# Patient Record
Sex: Female | Born: 1970 | Race: White | Hispanic: No | Marital: Married | Smoking: Former smoker
Health system: Southern US, Community
[De-identification: ages and names within clinical notes are randomized; demographics above are authoritative.]

## PROBLEM LIST (undated history)

## (undated) DIAGNOSIS — R112 Nausea with vomiting, unspecified: Secondary | ICD-10-CM

## (undated) DIAGNOSIS — Z923 Personal history of irradiation: Secondary | ICD-10-CM

## (undated) DIAGNOSIS — Z789 Other specified health status: Secondary | ICD-10-CM

## (undated) DIAGNOSIS — Z9889 Other specified postprocedural states: Secondary | ICD-10-CM

## (undated) DIAGNOSIS — D219 Benign neoplasm of connective and other soft tissue, unspecified: Secondary | ICD-10-CM

## (undated) HISTORY — DX: Personal history of irradiation: Z92.3

## (undated) HISTORY — PX: ABDOMINAL HYSTERECTOMY: SHX81

## (undated) HISTORY — PX: OTHER SURGICAL HISTORY: SHX169

## (undated) HISTORY — DX: Nausea with vomiting, unspecified: R11.2

## (undated) HISTORY — PX: HYSTEROSCOPY: SHX211

## (undated) HISTORY — PX: TONSILLECTOMY: SUR1361

## (undated) HISTORY — DX: Benign neoplasm of connective and other soft tissue, unspecified: D21.9

## (undated) HISTORY — PX: DILATION AND CURETTAGE OF UTERUS: SHX78

---

## 2000-05-11 ENCOUNTER — Other Ambulatory Visit: Admission: RE | Admit: 2000-05-11 | Discharge: 2000-05-11 | Payer: Self-pay | Admitting: Gynecology

## 2001-07-26 ENCOUNTER — Other Ambulatory Visit: Admission: RE | Admit: 2001-07-26 | Discharge: 2001-07-26 | Payer: Self-pay | Admitting: Gynecology

## 2003-05-07 ENCOUNTER — Other Ambulatory Visit: Admission: RE | Admit: 2003-05-07 | Discharge: 2003-05-07 | Payer: Self-pay | Admitting: Gynecology

## 2004-08-13 ENCOUNTER — Other Ambulatory Visit: Admission: RE | Admit: 2004-08-13 | Discharge: 2004-08-13 | Payer: Self-pay | Admitting: Obstetrics and Gynecology

## 2008-06-03 ENCOUNTER — Encounter: Admission: RE | Admit: 2008-06-03 | Discharge: 2008-06-03 | Payer: Self-pay | Admitting: Obstetrics and Gynecology

## 2009-09-29 ENCOUNTER — Ambulatory Visit (HOSPITAL_COMMUNITY): Admission: RE | Admit: 2009-09-29 | Discharge: 2009-09-29 | Payer: Self-pay | Admitting: Obstetrics and Gynecology

## 2010-04-16 LAB — CBC
HCT: 39.2 % (ref 36.0–46.0)
Hemoglobin: 13.5 g/dL (ref 12.0–15.0)
MCV: 93.8 fL (ref 78.0–100.0)
RBC: 4.18 MIL/uL (ref 3.87–5.11)
RDW: 12.2 % (ref 11.5–15.5)
WBC: 5.9 10*3/uL (ref 4.0–10.5)

## 2010-04-16 LAB — URINALYSIS, ROUTINE W REFLEX MICROSCOPIC
Bilirubin Urine: NEGATIVE
Glucose, UA: NEGATIVE mg/dL
Hgb urine dipstick: NEGATIVE
Specific Gravity, Urine: 1.015 (ref 1.005–1.030)
Urobilinogen, UA: 0.2 mg/dL (ref 0.0–1.0)
pH: 7.5 (ref 5.0–8.0)

## 2010-07-28 ENCOUNTER — Other Ambulatory Visit: Payer: Self-pay | Admitting: Obstetrics and Gynecology

## 2011-12-19 ENCOUNTER — Encounter (HOSPITAL_COMMUNITY): Payer: Self-pay | Admitting: Pharmacist

## 2011-12-28 ENCOUNTER — Encounter (HOSPITAL_COMMUNITY): Payer: Self-pay

## 2011-12-28 ENCOUNTER — Encounter (HOSPITAL_COMMUNITY)
Admission: RE | Admit: 2011-12-28 | Discharge: 2011-12-28 | Disposition: A | Discharge status: Home / Self Care | Payer: BC Managed Care – PPO | Source: Ambulatory Visit | Attending: Obstetrics and Gynecology | Admitting: Obstetrics and Gynecology

## 2011-12-28 HISTORY — DX: Other specified health status: Z78.9

## 2011-12-28 LAB — CBC
Hemoglobin: 13.1 g/dL (ref 12.0–15.0)
MCHC: 34.9 g/dL (ref 30.0–36.0)
WBC: 5.7 10*3/uL (ref 4.0–10.5)

## 2011-12-28 LAB — SURGICAL PCR SCREEN
MRSA, PCR: NEGATIVE
Staphylococcus aureus: NEGATIVE

## 2011-12-28 NOTE — Patient Instructions (Addendum)
18 Shelly [name]  12/28/2011   Your procedure is scheduled on:  01/02/12  Enter through the Main Entrance of [facility] at 6 AM.  Pick up the phone at the desk and dial [phone].   Call this number if you have problems the morning of surgery: [phone]   Remember:   Do not eat food:After Midnight.  Do not drink clear liquids: After Midnight.  Take these medicines the morning of surgery with A SIP OF WATER: NA   Do not wear jewelry, make-up or nail polish.  Do not wear lotions, powders, or perfumes. You may wear deodorant.  Do not shave 48 hours prior to surgery.  Do not bring valuables to the hospital.  Contacts, dentures or bridgework may not be worn into surgery.  Leave suitcase in the car. After surgery it may be brought to your room.  For patients admitted to the hospital, checkout time is 11:00 AM the day of discharge.   Patients discharged the day of surgery will not be allowed to drive home.  Name and phone number of your driver: NA  Special Instructions: Shower using CHG 2 nights before surgery and the night before surgery.  If you shower the day of surgery use CHG.  Use special wash - you have one bottle of CHG for all showers.  You should use approximately 1/3 of the bottle for each shower.   Please read over the following fact sheets that you were given: MRSA Information

## 2011-12-28 NOTE — H&P (Signed)
Shelly [name] is an 41 y.o. female.   Chief Complaint:abdominal bloating, feeling "full" HPI: 41 yo PWF G0P0 with large fibroids and above complaints for definitive tx with R-TLH/bilat salping.  Past Medical History  Diagnosis Date  . No pertinent past medical history     Past Surgical History  Procedure Date  . Tonsillectomy   . Uterine ablation     No family history on file. Social History:  reports that she has quit smoking. She has never used smokeless tobacco. She reports that she drinks alcohol. She reports that she uses illicit drugs (Solvent inhalants).  Allergies: No Known Allergies  No prescriptions prior to admission    Results for orders placed during the hospital encounter of 12/28/11 (from the past 48 hour(s))  CBC     Status: Normal   Collection Time   12/28/11  8:13 AM      Component Value Range Comment   WBC 5.7  4.0 - 10.5 K/uL    RBC 4.21  3.87 - 5.11 MIL/uL    Hemoglobin 13.1  12.0 - 15.0 g/dL    HCT 45.4  09.8 - 11.9 %    MCV 89.1  78.0 - 100.0 fL    MCH 31.1  26.0 - 34.0 pg    MCHC 34.9  30.0 - 36.0 g/dL    RDW 14.7  82.9 - 56.2 %    Platelets 203  150 - 400 K/uL   SURGICAL PCR SCREEN     Status: Normal   Collection Time   12/28/11  9:03 AM      Component Value Range Comment   MRSA, PCR NEGATIVE  NEGATIVE    Staphylococcus aureus NEGATIVE  NEGATIVE    No results found.  Review of Systems  All other systems reviewed and are negative.    Height 5\' 6"  (1.676 m), weight 63.504 kg (140 lb). Physical Exam  Constitutional: She is oriented to person, place, and time. She appears well-developed and well-nourished.  HENT:  Head: Normocephalic and atraumatic.  Eyes: Conjunctivae normal are normal.  Neck: Normal range of motion. Neck supple. No thyromegaly present.  Cardiovascular: Normal rate, regular rhythm and normal heart sounds.   Respiratory: Effort normal and breath sounds normal. No respiratory distress.  GI: Soft. Bowel sounds are  normal. She exhibits no distension. There is no tenderness.  Genitourinary: Vagina normal.       Uterus 14 week size mobile, largest fibroid off L fundus  Musculoskeletal: Normal range of motion.  Neurological: She is alert and oriented to person, place, and time.  Skin: Skin is warm and dry.  Psychiatric: She has a normal mood and affect.     Assessment/Plan Symptomatic fibroids, for R-TLH, bilat salping.  [name],[name] 12/28/2011, 12:55 PM

## 2012-01-01 DIAGNOSIS — D219 Benign neoplasm of connective and other soft tissue, unspecified: Secondary | ICD-10-CM

## 2012-01-01 HISTORY — DX: Benign neoplasm of connective and other soft tissue, unspecified: D21.9

## 2012-01-01 MED ORDER — CEFOTETAN DISODIUM 2 G IJ SOLR
2.0000 g | INTRAMUSCULAR | Status: AC
  Administered 2012-01-02: 2 g via INTRAVENOUS
  Filled 2012-01-01: qty 2

## 2012-01-02 ENCOUNTER — Ambulatory Visit (HOSPITAL_COMMUNITY)
Admission: RE | Admit: 2012-01-02 | Discharge: 2012-01-02 | Disposition: A | Discharge status: Home / Self Care | Payer: BC Managed Care – PPO | Source: Ambulatory Visit | Attending: Obstetrics and Gynecology | Admitting: Obstetrics and Gynecology

## 2012-01-02 ENCOUNTER — Ambulatory Visit (HOSPITAL_COMMUNITY): Payer: BC Managed Care – PPO | Admitting: Anesthesiology

## 2012-01-02 ENCOUNTER — Encounter (HOSPITAL_COMMUNITY): Payer: Self-pay | Admitting: Anesthesiology

## 2012-01-02 ENCOUNTER — Encounter (HOSPITAL_COMMUNITY)
Admission: RE | Disposition: A | Discharge status: Home / Self Care | Payer: Self-pay | Source: Ambulatory Visit | Attending: Obstetrics and Gynecology

## 2012-01-02 ENCOUNTER — Encounter (HOSPITAL_COMMUNITY): Payer: Self-pay | Admitting: *Deleted

## 2012-01-02 DIAGNOSIS — D251 Intramural leiomyoma of uterus: Secondary | ICD-10-CM | POA: Insufficient documentation

## 2012-01-02 DIAGNOSIS — N8000 Endometriosis of the uterus, unspecified: Secondary | ICD-10-CM | POA: Insufficient documentation

## 2012-01-02 DIAGNOSIS — Z01812 Encounter for preprocedural laboratory examination: Secondary | ICD-10-CM | POA: Insufficient documentation

## 2012-01-02 DIAGNOSIS — Z01818 Encounter for other preprocedural examination: Secondary | ICD-10-CM | POA: Insufficient documentation

## 2012-01-02 HISTORY — PX: CYSTOSCOPY: SHX5120

## 2012-01-02 HISTORY — PX: ROBOTIC ASSISTED TOTAL HYSTERECTOMY: SHX6085

## 2012-01-02 LAB — COMPREHENSIVE METABOLIC PANEL
ALT: 9 U/L (ref 0–35)
Albumin: 3.7 g/dL (ref 3.5–5.2)
Alkaline Phosphatase: 46 U/L (ref 39–117)
Chloride: 103 mEq/L (ref 96–112)
GFR calc Af Amer: >90 mL/min (ref >90)
Glucose, Bld: 93 mg/dL (ref 70–99)
Potassium: 3.6 mEq/L (ref 3.5–5.1)
Sodium: 138 mEq/L (ref 135–145)
Total Bilirubin: 0.3 mg/dL (ref 0.3–1.2)
Total Protein: 7 g/dL (ref 6.0–8.3)

## 2012-01-02 LAB — HCG, SERUM, QUALITATIVE: Preg, Serum: NEGATIVE

## 2012-01-02 SURGERY — ROBOTIC ASSISTED TOTAL HYSTERECTOMY
Anesthesia: General | Site: Bladder | Wound class: Clean Contaminated

## 2012-01-02 MED ORDER — PROPOFOL 10 MG/ML IV EMUL
INTRAVENOUS | Status: DC | PRN
  Administered 2012-01-02: 150 mg via INTRAVENOUS

## 2012-01-02 MED ORDER — DEXTROSE IN LACTATED RINGERS 5 % IV SOLN
INTRAVENOUS | Status: DC
  Administered 2012-01-02: 17:00:00 via INTRAVENOUS

## 2012-01-02 MED ORDER — FENTANYL CITRATE 0.05 MG/ML IJ SOLN
INTRAMUSCULAR | Status: AC
  Filled 2012-01-02: qty 5

## 2012-01-02 MED ORDER — LACTATED RINGERS IR SOLN
Status: DC | PRN
  Administered 2012-01-02: 3000 mL

## 2012-01-02 MED ORDER — PROPOFOL 10 MG/ML IV EMUL
INTRAVENOUS | Status: AC
  Filled 2012-01-02: qty 20

## 2012-01-02 MED ORDER — EPHEDRINE SULFATE 50 MG/ML IJ SOLN
INTRAMUSCULAR | Status: DC | PRN
  Administered 2012-01-02 (×3): 5 mg via INTRAVENOUS

## 2012-01-02 MED ORDER — FENTANYL CITRATE 0.05 MG/ML IJ SOLN
25.0000 ug | INTRAMUSCULAR | Status: DC | PRN
  Administered 2012-01-02 (×5): 25 ug via INTRAVENOUS

## 2012-01-02 MED ORDER — ACETAMINOPHEN 10 MG/ML IV SOLN
1000.0000 mg | Freq: Once | INTRAVENOUS | Status: DC
  Filled 2012-01-02: qty 100

## 2012-01-02 MED ORDER — FENTANYL CITRATE 0.05 MG/ML IJ SOLN
INTRAMUSCULAR | Status: DC | PRN
  Administered 2012-01-02 (×4): 50 ug via INTRAVENOUS

## 2012-01-02 MED ORDER — KETOROLAC TROMETHAMINE 30 MG/ML IJ SOLN: 15.0000 mg | Freq: Once | INTRAMUSCULAR | Status: DC | PRN

## 2012-01-02 MED ORDER — FENTANYL CITRATE 0.05 MG/ML IJ SOLN
INTRAMUSCULAR | Status: AC
  Administered 2012-01-02: 25 ug via INTRAVENOUS
  Filled 2012-01-02: qty 2

## 2012-01-02 MED ORDER — METOCLOPRAMIDE HCL 5 MG/ML IJ SOLN
INTRAMUSCULAR | Status: AC
  Administered 2012-01-02: 10 mg via INTRAVENOUS
  Filled 2012-01-02: qty 2

## 2012-01-02 MED ORDER — ONDANSETRON HCL 4 MG/2ML IJ SOLN: 4.0000 mg | Freq: Once | INTRAMUSCULAR | Status: DC | PRN

## 2012-01-02 MED ORDER — NEOSTIGMINE METHYLSULFATE 1 MG/ML IJ SOLN
INTRAMUSCULAR | Status: DC | PRN
  Administered 2012-01-02: 4 mg via INTRAVENOUS

## 2012-01-02 MED ORDER — ROPIVACAINE HCL 5 MG/ML IJ SOLN
INTRAMUSCULAR | Status: AC
  Filled 2012-01-02: qty 60

## 2012-01-02 MED ORDER — LIDOCAINE HCL (CARDIAC) 20 MG/ML IV SOLN
INTRAVENOUS | Status: DC | PRN
  Administered 2012-01-02: 80 mg via INTRAVENOUS

## 2012-01-02 MED ORDER — MORPHINE SULFATE 4 MG/ML IJ SOLN
1.0000 mg | INTRAMUSCULAR | Status: DC | PRN
  Administered 2012-01-02: 1 mg via INTRAVENOUS
  Filled 2012-01-02: qty 1

## 2012-01-02 MED ORDER — ARTIFICIAL TEARS OP OINT
TOPICAL_OINTMENT | OPHTHALMIC | Status: DC | PRN
  Administered 2012-01-02: 1 via OPHTHALMIC

## 2012-01-02 MED ORDER — MIDAZOLAM HCL 5 MG/5ML IJ SOLN
INTRAMUSCULAR | Status: DC | PRN
  Administered 2012-01-02: 2 mg via INTRAVENOUS

## 2012-01-02 MED ORDER — ROCURONIUM BROMIDE 50 MG/5ML IV SOLN
INTRAVENOUS | Status: AC
  Filled 2012-01-02: qty 2

## 2012-01-02 MED ORDER — ONDANSETRON HCL 4 MG/2ML IJ SOLN
INTRAMUSCULAR | Status: AC
  Filled 2012-01-02: qty 2

## 2012-01-02 MED ORDER — LIDOCAINE HCL (CARDIAC) 20 MG/ML IV SOLN
INTRAVENOUS | Status: AC
  Filled 2012-01-02: qty 5

## 2012-01-02 MED ORDER — GLYCOPYRROLATE 0.2 MG/ML IJ SOLN
INTRAMUSCULAR | Status: DC | PRN
  Administered 2012-01-02: .8 mg via INTRAVENOUS

## 2012-01-02 MED ORDER — METOCLOPRAMIDE HCL 5 MG/ML IJ SOLN
10.0000 mg | Freq: Once | INTRAMUSCULAR | Status: AC
  Administered 2012-01-02: 10 mg via INTRAVENOUS

## 2012-01-02 MED ORDER — INDIGOTINDISULFONATE SODIUM 8 MG/ML IJ SOLN
INTRAMUSCULAR | Status: DC | PRN
  Administered 2012-01-02: 3 mL via INTRAVENOUS

## 2012-01-02 MED ORDER — ROCURONIUM BROMIDE 100 MG/10ML IV SOLN
INTRAVENOUS | Status: DC | PRN
  Administered 2012-01-02 (×3): 10 mg via INTRAVENOUS
  Administered 2012-01-02: 40 mg via INTRAVENOUS
  Administered 2012-01-02 (×3): 10 mg via INTRAVENOUS

## 2012-01-02 MED ORDER — GLYCOPYRROLATE 0.2 MG/ML IJ SOLN
INTRAMUSCULAR | Status: AC
  Filled 2012-01-02: qty 1

## 2012-01-02 MED ORDER — KETOROLAC TROMETHAMINE 30 MG/ML IJ SOLN
INTRAMUSCULAR | Status: DC | PRN
  Administered 2012-01-02: 30 mg via INTRAVENOUS

## 2012-01-02 MED ORDER — EPHEDRINE 5 MG/ML INJ
INTRAVENOUS | Status: AC
  Filled 2012-01-02: qty 10

## 2012-01-02 MED ORDER — OXYCODONE-ACETAMINOPHEN 5-325 MG PO TABS: 1.0000 | ORAL_TABLET | ORAL | Status: DC | PRN

## 2012-01-02 MED ORDER — LACTATED RINGERS IV SOLN
INTRAVENOUS | Status: DC
  Administered 2012-01-02: 07:00:00 via INTRAVENOUS

## 2012-01-02 MED ORDER — KETOROLAC TROMETHAMINE 30 MG/ML IJ SOLN
INTRAMUSCULAR | Status: AC
  Filled 2012-01-02: qty 1

## 2012-01-02 MED ORDER — INDIGOTINDISULFONATE SODIUM 8 MG/ML IJ SOLN
INTRAMUSCULAR | Status: AC
  Filled 2012-01-02: qty 5

## 2012-01-02 MED ORDER — ONDANSETRON HCL 8 MG PO TABS: 8.0000 mg | ORAL_TABLET | Freq: Three times a day (TID) | ORAL | Status: DC | PRN

## 2012-01-02 MED ORDER — ONDANSETRON 8 MG/NS 50 ML IVPB
8.0000 mg | Freq: Once | INTRAVENOUS | Status: DC
  Filled 2012-01-02: qty 8

## 2012-01-02 MED ORDER — ACETAMINOPHEN 10 MG/ML IV SOLN
INTRAVENOUS | Status: DC | PRN
  Administered 2012-01-02: 1000 mg via INTRAVENOUS

## 2012-01-02 MED ORDER — LACTATED RINGERS IV SOLN
INTRAVENOUS | Status: DC | PRN
  Administered 2012-01-02 (×2): via INTRAVENOUS

## 2012-01-02 MED ORDER — ACETAMINOPHEN 10 MG/ML IV SOLN
1000.0000 mg | Freq: Four times a day (QID) | INTRAVENOUS | Status: DC
  Filled 2012-01-02 (×4): qty 100

## 2012-01-02 MED ORDER — DEXAMETHASONE SODIUM PHOSPHATE 10 MG/ML IJ SOLN
INTRAMUSCULAR | Status: AC
  Filled 2012-01-02: qty 1

## 2012-01-02 MED ORDER — MIDAZOLAM HCL 2 MG/2ML IJ SOLN
INTRAMUSCULAR | Status: AC
  Filled 2012-01-02: qty 2

## 2012-01-02 MED ORDER — STERILE WATER FOR IRRIGATION IR SOLN
Status: DC | PRN
  Administered 2012-01-02: 1000 mL

## 2012-01-02 MED ORDER — ROPIVACAINE HCL 5 MG/ML IJ SOLN
INTRAMUSCULAR | Status: DC | PRN
  Administered 2012-01-02: 60 mL

## 2012-01-02 MED ORDER — SODIUM CHLORIDE 0.9 % IJ SOLN
INTRAMUSCULAR | Status: DC | PRN
  Administered 2012-01-02: 60 mL

## 2012-01-02 MED ORDER — GLYCOPYRROLATE 0.2 MG/ML IJ SOLN
INTRAMUSCULAR | Status: AC
  Filled 2012-01-02: qty 3

## 2012-01-02 MED ORDER — NEOSTIGMINE METHYLSULFATE 1 MG/ML IJ SOLN
INTRAMUSCULAR | Status: AC
  Filled 2012-01-02: qty 10

## 2012-01-02 SURGICAL SUPPLY — 81 items
ADH SKN CLS APL DERMABOND .7 (GAUZE/BANDAGES/DRESSINGS) ×3
APL SKNCLS STERI-STRIP NONHPOA (GAUZE/BANDAGES/DRESSINGS)
BAG URINE DRAINAGE (UROLOGICAL SUPPLIES) ×2 IMPLANT
BARRIER ADHS 3X4 INTERCEED (GAUZE/BANDAGES/DRESSINGS) ×4 IMPLANT
BENZOIN TINCTURE PRP APPL 2/3 (GAUZE/BANDAGES/DRESSINGS) ×2 IMPLANT
BRR ADH 4X3 ABS CNTRL BYND (GAUZE/BANDAGES/DRESSINGS) ×3
CABLE HIGH FREQUENCY MONO STRZ (ELECTRODE) ×4 IMPLANT
CHLORAPREP W/TINT 26ML (MISCELLANEOUS) ×4 IMPLANT
CONT PATH 16OZ SNAP LID 3702 (MISCELLANEOUS) ×4 IMPLANT
COVER MAYO STAND STRL (DRAPES) ×4 IMPLANT
COVER TABLE BACK 60X90 (DRAPES) ×8 IMPLANT
COVER TIP SHEARS 8 DVNC (MISCELLANEOUS) ×3 IMPLANT
COVER TIP SHEARS 8MM DA VINCI (MISCELLANEOUS) ×1
DECANTER SPIKE VIAL GLASS SM (MISCELLANEOUS) ×10 IMPLANT
DERMABOND ADVANCED (GAUZE/BANDAGES/DRESSINGS) ×1
DERMABOND ADVANCED .7 DNX12 (GAUZE/BANDAGES/DRESSINGS) ×1 IMPLANT
DRAPE HUG U DISPOSABLE (DRAPE) ×4 IMPLANT
DRAPE LG THREE QUARTER DISP (DRAPES) ×8 IMPLANT
DRAPE PROXIMA HALF (DRAPES) ×4 IMPLANT
DRAPE WARM FLUID 44X44 (DRAPE) ×4 IMPLANT
ELECT REM PT RETURN 9FT ADLT (ELECTROSURGICAL) ×4
ELECTRODE REM PT RTRN 9FT ADLT (ELECTROSURGICAL) ×3 IMPLANT
EVACUATOR SMOKE 8.L (FILTER) ×4 IMPLANT
GAUZE VASELINE 3X9 (GAUZE/BANDAGES/DRESSINGS) IMPLANT
GLOVE BIO SURGEON STRL SZ 6.5 (GLOVE) ×14 IMPLANT
GLOVE BIOGEL PI IND STRL 7.0 (GLOVE) ×16 IMPLANT
GLOVE BIOGEL PI INDICATOR 7.0 (GLOVE) ×8
GLOVE ECLIPSE 6.5 STRL STRAW (GLOVE) ×18 IMPLANT
GLOVE SURG SS PI 7.0 STRL IVOR (GLOVE) ×8 IMPLANT
GOWN STRL REIN XL XLG (GOWN DISPOSABLE) ×28 IMPLANT
GYRUS RUMI II 2.5CM BLUE (DISPOSABLE)
GYRUS RUMI II 3.5CM BLUE (DISPOSABLE)
GYRUS RUMI II 4.0CM BLUE (DISPOSABLE)
KIT ACCESSORY DA VINCI DISP (KITS) ×1
KIT ACCESSORY DVNC DISP (KITS) ×3 IMPLANT
LEGGING LITHOTOMY PAIR STRL (DRAPES) ×6 IMPLANT
NDL INSUFFLATION 14GA 120MM (NEEDLE) ×2 IMPLANT
NEEDLE INSUFFLATION 14GA 120MM (NEEDLE) ×4 IMPLANT
OCCLUDER COLPOPNEUMO (BALLOONS) ×2 IMPLANT
PACK LAVH (CUSTOM PROCEDURE TRAY) ×4 IMPLANT
PAD OB MATERNITY 4.3X12.25 (PERSONAL CARE ITEMS) ×2 IMPLANT
PAD PREP 24X48 CUFFED NSTRL (MISCELLANEOUS) ×8 IMPLANT
PLUG CATH AND CAP STER (CATHETERS) ×4 IMPLANT
PROTECTOR NERVE ULNAR (MISCELLANEOUS) ×8 IMPLANT
RUMI II 3.0CM BLUE KOH-EFFICIE (DISPOSABLE) IMPLANT
RUMI II GYRUS 2.5CM BLUE (DISPOSABLE) IMPLANT
RUMI II GYRUS 3.5CM BLUE (DISPOSABLE) IMPLANT
RUMI II GYRUS 4.0CM BLUE (DISPOSABLE) IMPLANT
SET CYSTO W/LG BORE CLAMP LF (SET/KITS/TRAYS/PACK) ×4 IMPLANT
SET IRRIG TUBING LAPAROSCOPIC (IRRIGATION / IRRIGATOR) ×4 IMPLANT
SOLUTION ELECTROLUBE (MISCELLANEOUS) ×4 IMPLANT
STRIP CLOSURE SKIN 1/4X4 (GAUZE/BANDAGES/DRESSINGS) ×2 IMPLANT
SUT VIC AB 0 CT1 27 (SUTURE) ×20
SUT VIC AB 0 CT1 27XBRD ANBCTR (SUTURE) ×4 IMPLANT
SUT VIC AB 0 CT1 27XBRD ANTBC (SUTURE) ×3 IMPLANT
SUT VIC AB 3-0 SH 27 (SUTURE) ×4
SUT VIC AB 3-0 SH 27X BRD (SUTURE) ×1 IMPLANT
SUT VICRYL 0 UR6 27IN ABS (SUTURE) ×6 IMPLANT
SUT VICRYL RAPIDE 4/0 PS 2 (SUTURE) ×8 IMPLANT
SUT VLOC 180 0 9IN  GS21 (SUTURE) ×1
SUT VLOC 180 0 9IN GS21 (SUTURE) ×1 IMPLANT
SYR 30ML LL (SYRINGE) ×4 IMPLANT
SYR 50ML LL SCALE MARK (SYRINGE) ×6 IMPLANT
SYRINGE 10CC LL (SYRINGE) ×4 IMPLANT
SYSTEM CONVERTIBLE TROCAR (TROCAR) ×4 IMPLANT
TIP RUMI ORANGE 6.7MMX12CM (TIP) IMPLANT
TIP UTERINE 5.1X6CM LAV DISP (MISCELLANEOUS) IMPLANT
TIP UTERINE 6.7X10CM GRN DISP (MISCELLANEOUS) ×2 IMPLANT
TIP UTERINE 6.7X6CM WHT DISP (MISCELLANEOUS) IMPLANT
TIP UTERINE 6.7X8CM BLUE DISP (MISCELLANEOUS) IMPLANT
TOWEL OR 17X24 6PK STRL BLUE (TOWEL DISPOSABLE) ×12 IMPLANT
TRAY FOLEY BAG SILVER LF 14FR (CATHETERS) ×4 IMPLANT
TROCAR 12M 150ML BLUNT (TROCAR) IMPLANT
TROCAR DISP BLADELESS 8 DVNC (TROCAR) ×3 IMPLANT
TROCAR DISP BLADELESS 8MM (TROCAR) ×1
TROCAR XCEL 12X100 BLDLESS (ENDOMECHANICALS) ×2 IMPLANT
TROCAR XCEL NON-BLD 5MMX100MML (ENDOMECHANICALS) ×4 IMPLANT
TROCAR Z THRD FIOS 12X150 (TROCAR) ×4 IMPLANT
TUBING FILTER THERMOFLATOR (ELECTROSURGICAL) ×4 IMPLANT
WARMER LAPAROSCOPE (MISCELLANEOUS) ×4 IMPLANT
WATER STERILE IRR 1000ML POUR (IV SOLUTION) ×12 IMPLANT

## 2012-01-02 NOTE — Interval H&P Note (Signed)
History and Physical Interval Note:  01/02/2012 7:10 AM  Shelly [name]  has presented today for surgery, with the diagnosis of fibroids  The various methods of treatment have been discussed with the patient and family. After consideration of risks, benefits and other options for treatment, the patient has consented to  Procedure(s) (LRB) with comments: ROBOTIC ASSISTED TOTAL HYSTERECTOMY WITH BILATERAL SALPINGO OOPHORECTOMY (N/A) - requests 4 hours as a surgical intervention .  The patient's history has been reviewed, patient examined, no change in status, stable for surgery.  I have reviewed the patient's chart and labs.  Questions were answered to the patient's satisfaction.     [name],[name]

## 2012-01-02 NOTE — Progress Notes (Signed)
VSS, Afebrile.  Pt states pain quite well controlled.  Was unable to void until about 6 pm, and bladder scan revealed about 300 cc in bladder.  Order given for I and O cath, but pt asked to try to void once more, and did void 200 cc.  She has had some nausea and vomiting, and has been given 8 mg IV zofran.  She reports peanut butter crackers tasted good.  Pt alert and talkative.  Findings at surgery discussed.    Exam:  Abd soft, flat, min tender.  Scant vag bleeding.  PAS in place.    D/C instructions given.  RX given for zofran 8 mg po # 20 one po q8 hr prn nausea.  Dr. Henderson Cloud in room to hear instructions.  Call in N & V persists tomorrow, and for fever and increasing pain.  Ok for discharge later this eve if nausea is controlled post zofran.

## 2012-01-02 NOTE — Anesthesia Postprocedure Evaluation (Signed)
Anesthesia Post Note  Patient: Shelly [name]  Procedure(s) Performed: Procedure(s) (LRB): ROBOTIC ASSISTED TOTAL HYSTERECTOMY (N/A) CYSTOSCOPY (N/A)  Anesthesia type: General  Patient location: PACU  Post pain: Pain level controlled  Post assessment: Post-op Vital signs reviewed  Last Vitals:  Filed Vitals:   01/02/12 1330  BP: 109/61  Pulse: 61  Temp: 36.3 C  Resp: 16    Post vital signs: Reviewed  Level of consciousness: sedated  Complications: No apparent anesthesia complications

## 2012-01-02 NOTE — Anesthesia Procedure Notes (Signed)
Procedure Name: Intubation Date/Time: 01/02/2012 7:30 AM Performed by: Graciela Husbands Pre-anesthesia Checklist: Suction available, Emergency Drugs available, Timeout performed, Patient identified and Patient being monitored Patient Re-evaluated:Patient Re-evaluated prior to inductionOxygen Delivery Method: Circle system utilized Preoxygenation: Pre-oxygenation with 100% oxygen Intubation Type: IV induction Ventilation: Mask ventilation without difficulty Laryngoscope Size: Mac and 3 Grade View: Grade I Tube size: 7.0 mm Number of attempts: 1 Airway Equipment and Method: Stylet Placement Confirmation: ETT inserted through vocal cords under direct vision,  positive ETCO2 and breath sounds checked- equal and bilateral Secured at: 22 cm Tube secured with: Tape Dental Injury: Teeth and Oropharynx as per pre-operative assessment

## 2012-01-02 NOTE — Op Note (Signed)
Preoperative diagnosis: Large symptomatic uterine fibroids Postoperative diagnosis: Same, path pending Procedure: Robotic assisted total laparoscopic hysterectomy with bilateral salpingectomy and cystoscopy Surgeon: Dr. Meredeth Ide Assistants: Dr. Leda Quail and Douglass Rivers Anesthesia : Gen. endotracheal Estimated blood loss: 25 cc Complications: None Procedure: The patient was taken to the operating room and was given IV sedation per anesthesia.  After placing her in the dorsolithotomy position and properly positioning her for the procedure, general endotracheal anesthesia was induced.  The patient was prepped and draped in usual fashion.  A posterior weighted and anterior Deaver were placed in the vagina and the cervix was grasped on its anterior and posterior lips with a single-tooth tenaculum.  The uterus sounded to 10 cm.  The cervix was dilated to a #19 Shawnie Pons, and a 10 cm Rumi manipulator with a medium Koh ring were placed without difficulty.  The bladder was drained with a Foley catheter.  While surgeon was placing the vaginal instruments, the assistant had begun laparoscopy.  After infiltrating the skin with with dilute quarter percent Marcaine.  A small nick was made at the umbilicus and a varies needle inserted into the peritoneal space.  Proper placement was noted with the hanging drop technique.  Pneumoperitoneum was created with 3 L of CO2.  After infiltrating the skin with dilute ropivacaine.  A transverse small midline incision was made approximately 53 fingerbreadths above the umbilicus.  A 12 mm bladed trocar was then introduced into the peritoneal space and its proper placement noted with the laparoscope.  Sites for the robotic ports to the right and the left of the camera port were marked infiltrated with Marcaine and the trochars inserted under direct visualization.  Likewise a 5 mm trocar was placed in the lower right quadrant under direct visualization.  The robot was then  brought in and docked from the side.  Monopolar scissors and PK gyrus were then inserted into the robotic ports under direct visualization.  The surgeon then proceeded to the consult.  The right ureter could be plainly seen peristalsing.  The left ureter could not be seen well do to some adhesions from the bowel to the sidewall and also to 2.  The filmy adhesions of the bowel to the tube were taken down sharply.  The ureter still could not be seen plainly.  The anterior and posterior cul-de-sacs were free.  The tubes appeared normal.  There was a large fundal fibroid approximately 9 cm with a broad based attachment to the fundus.  There was also an approximately 6 cm fibroid off the lower uterine segment on the patient's right.  The procedure began on the patient's right, separating the tubal fimbria from the ovary using cautery, and serially coagulating and cutting along the mesosalpinx to the cornu.  The uterine ovarian ligament was cauterized and cut.  The round ligament was cauterized and cut.  Anterior and posterior leaves of the broad ligament were taken down sharply.  The bladder was dissected off the [facility] ring using sharp dissection.  Uterine artery was skeletonized, coagulated multiple times.  Attention was then turned to the patient's left.  The tube was separated from the ovary its fimbriated end using the PK gyrus.  The tube was sequentially coagulated and cut down to the cornu.  The utero-ovarian ligament was cauterized and cut.  The round ligament was cauterized and cut.  Anterior and posterior leaves of the broad ligament were taken down sharply.  The uterine artery was skeletonized, coagulated multiple times, and cut.  Attention was turned back to the patient's right.  The uterine artery was again identified, cauterized, and cut. It was clear that the uterus was not going to be able to be removed from the pelvis through the vagina without morcellation.  Therefore the monopolar cut function was used to  detach the smaller fibroid that emanated from the right side of the fundus.  Then the large fibroid on the fundus was cut in half and half of it was removed and placed into the cul-de-sac. A circumferential colpotomy incision was then made with the monopolar scissors.  During the colpotomy the robot was gated a fall message which indicated that the arm #1 needed to be disabled.  The rectum into was in the room and recommended that we remove the instruments from the pelvis and restart the robot.  This was done.  Upon restarting the robot and reinserted and the instruments, normal functioning of the robot was noted.  A colpotomy incision was completed.  An attempt was made to remove the uterus with the now smaller fundal fibroid however the fibroid was still too large to be removed through the vagina.  The surgeon then proceeded to the vagina, and vaginally morcellated the large fibroid.  The other half of the fundal fibroid and the right-sided fibroid were also removed manually through the vagina.  The occluder was then placed back in the vagina and the surgeon went back to the consult.  A 0 V LOC Vicryl was dropped in through the camera port and used to close the cuff in running fashion.  Needle was parked in the right paracolic gutter.  The pelvis was irrigated and inspected.  There was a small bleeder from the left ovary were there had apparently been a hemorrhagic cyst.  Monopolar cautery was used to tack the ovary gently and it became hemostatic.  Rest of the pelvis was perfectly hemostatic.  It was irrigated copiously.  Sheet of Interceed was brought in and placed over the vaginal cuff.  Robotic instruments were removed.  The robot was undocked and were removed.  The 8 mm camera was used to then allow a [place] forceps to go in through the midline port to retrieve the LOC needle which was done successfully.  The upper abdomen was then inspected and was normal.  The trochars were removed under direct  visualization except for the camera port.  Pneumoperitoneum was assisted in evacuation by having the CRNA gave the patient manual deep breaths as the surgeon and assistant put pressure on the abdominal wall.  Then the port was removed, the fascia was closed with a single suture of 0 Vicryl, and the skin of all the incisions was closed subcuticularly with 4-0 Vicryl and Dermabond.  The Foley catheter was removed.  The cystoscope was introduced and good E. flocks of urine could be seen from each ureteral orifice bilaterally.  The patient had been given indigo carmine prior to do cystoscopy and there was clear blue urine coming from both ureteral orifices.  The dome of the bladder was inspected and was normal.  The bladder was drained and the trocar sleeve was removed.  The vagina was inspected and was hemostatic.  There was a small bleeder on the vulva where apparently there done escape mark from the placement of the Koh ring and this was sutured with 3-0 Vicryl.  The procedure was terminated.  Sponge needle and instrument counts were correct.  The patient was taken to the recovery room in satisfactory condition.

## 2012-01-02 NOTE — Anesthesia Preprocedure Evaluation (Signed)
Anesthesia Evaluation  Patient identified by MRN, date of birth, ID band Patient awake    Reviewed: Allergy & Precautions, H&P , NPO status , Patient's Chart, lab work & pertinent test results, reviewed documented beta blocker date and time   History of Anesthesia Complications Negative for: history of anesthetic complications  Airway Mallampati: II TM Distance: >3 FB Neck ROM: full    Dental  (+) Teeth Intact Uneven top front teeth:   Pulmonary neg pulmonary ROS,  breath sounds clear to auscultation  Pulmonary exam normal       Cardiovascular Exercise Tolerance: Good negative cardio ROS  Rhythm:regular Rate:Normal     Neuro/Psych negative neurological ROS  negative psych ROS   GI/Hepatic negative GI ROS, Neg liver ROS,   Endo/Other  negative endocrine ROS  Renal/GU negative Renal ROS  Female GU complaint     Musculoskeletal   Abdominal   Peds  Hematology negative hematology ROS (+)   Anesthesia Other Findings   Reproductive/Obstetrics negative OB ROS                           Anesthesia Physical Anesthesia Plan  ASA: I  Anesthesia Plan: General ETT   Post-op Pain Management:    Induction:   Airway Management Planned:   Additional Equipment:   Intra-op Plan:   Post-operative Plan:   Informed Consent: I have reviewed the patients History and Physical, chart, labs and discussed the procedure including the risks, benefits and alternatives for the proposed anesthesia with the patient or authorized representative who has indicated his/her understanding and acceptance.   Dental Advisory Given  Plan Discussed with: CRNA and Surgeon  Anesthesia Plan Comments:         Anesthesia Quick Evaluation

## 2012-01-02 NOTE — Transfer of Care (Signed)
Immediate Anesthesia Transfer of Care Note  Patient: Shelly [name]  Procedure(s) Performed: Procedure(s) (LRB) with comments: ROBOTIC ASSISTED TOTAL HYSTERECTOMY (N/A) - Roboitc Bilateral Salpingectomy  CYSTOSCOPY (N/A)  Patient Location: PACU  Anesthesia Type:General  Level of Consciousness: awake, alert  and oriented  Airway & Oxygen Therapy: Patient Spontanous Breathing and Patient connected to nasal cannula oxygen  Post-op Assessment: Report given to PACU RN and Post -op Vital signs reviewed and stable  Post vital signs: Reviewed and stable  Complications: No apparent anesthesia complications

## 2012-01-03 ENCOUNTER — Encounter (HOSPITAL_COMMUNITY): Payer: Self-pay | Admitting: Obstetrics and Gynecology

## 2012-01-03 NOTE — Anesthesia Postprocedure Evaluation (Signed)
  Anesthesia Post-op Note  Patient: Shelly [name]  Procedure(s) Performed: Procedure(s) (LRB) with comments: ROBOTIC ASSISTED TOTAL HYSTERECTOMY (N/A) - Roboitc Bilateral Salpingectomy  CYSTOSCOPY (N/A)  Patient Location: [facility] Unit  Anesthesia Type:General  Level of Consciousness: awake, alert  and oriented  Airway and Oxygen Therapy: Patient Spontanous Breathing  Post-op Pain: mild  Post-op Assessment: Patient's Cardiovascular Status Stable, Respiratory Function Stable and No signs of Nausea or vomiting  Post-op Vital Signs: stable  Complications: No apparent anesthesia complications

## 2012-01-03 NOTE — Addendum Note (Signed)
Addendum  created 01/03/12 1610 by Earmon Phoenix, CRNA   Modules edited:Notes Section

## 2012-01-17 ENCOUNTER — Encounter (HOSPITAL_COMMUNITY): Payer: Self-pay

## 2012-05-21 ENCOUNTER — Emergency Department (HOSPITAL_COMMUNITY)
Admission: EM | Admit: 2012-05-21 | Discharge: 2012-05-21 | Disposition: A | Discharge status: Home / Self Care | Payer: BC Managed Care – PPO | Source: Home / Self Care | Attending: Emergency Medicine | Admitting: Emergency Medicine

## 2012-05-21 ENCOUNTER — Encounter (HOSPITAL_COMMUNITY): Payer: Self-pay | Admitting: Emergency Medicine

## 2012-05-21 DIAGNOSIS — R42 Dizziness and giddiness: Secondary | ICD-10-CM

## 2012-05-21 LAB — POCT I-STAT, CHEM 8
Calcium, Ion: 1.28 mmol/L — ABNORMAL HIGH (ref 1.12–1.23)
Chloride: 102 mEq/L (ref 96–112)
HCT: 41 % (ref 36.0–46.0)
Hemoglobin: 13.9 g/dL (ref 12.0–15.0)
TCO2: 30 mmol/L (ref 0–100)

## 2012-05-21 MED ORDER — DIAZEPAM 5 MG PO TABS: 5.0000 mg | ORAL_TABLET | Freq: Four times a day (QID) | ORAL | Status: DC | PRN

## 2012-05-21 MED ORDER — MECLIZINE HCL 50 MG PO TABS: 25.0000 mg | ORAL_TABLET | Freq: Three times a day (TID) | ORAL | Status: DC | PRN

## 2012-05-21 NOTE — ED Notes (Signed)
Woke yesterday with room spinning..  Better today than yesterday, but unsteady on feet, change in positions increases dizziness.  Patient has had nausea.  Reports in the last two weeks has felt stuffiness, sneezing  --"allergies"

## 2012-05-21 NOTE — ED Provider Notes (Signed)
History     CSN: [id number]  Arrival date & time 05/21/12  1259   First MD Initiated Contact with Patient 05/21/12 1511      Chief Complaint  Patient presents with  . Dizziness    (Consider location/radiation/quality/duration/timing/severity/associated sxs/prior treatment) HPI Comments: Pt woke up yesterday morning with sensation of room spinning. Not as bad today, but still feels dizzy.  Worse with position changes, nothing makes it better.  Associated with nausea, used some of her mother's phenergan yesterday to manage nausea.    The history is provided by the patient.    Past Medical History  Diagnosis Date  . No pertinent past medical history     Past Surgical History  Procedure Laterality Date  . Tonsillectomy    . Uterine ablation    . Robotic assisted total hysterectomy  01/02/2012    Procedure: ROBOTIC ASSISTED TOTAL HYSTERECTOMY;  Surgeon: Alison Murray, MD;  Location: [facility];  Service: Gynecology;  Laterality: N/A;  Roboitc Bilateral Salpingectomy   . Cystoscopy  01/02/2012    Procedure: CYSTOSCOPY;  Surgeon: Alison Murray, MD;  Location: [facility];  Service: Gynecology;  Laterality: N/A;    History reviewed. No pertinent family history.  History  Substance Use Topics  . Smoking status: Never Smoker   . Smokeless tobacco: Never Used  . Alcohol Use: Yes     Comment: wine occasionally    OB History   Grav Para Term Preterm Abortions TAB SAB Ect Mult Living                  Review of Systems  Constitutional: Negative for fever and chills.  HENT: Negative for congestion and sinus pressure.   Neurological: Positive for dizziness. Negative for facial asymmetry, weakness and headaches.    Allergies  Review of patient's allergies indicates no known allergies.  Home Medications   Current Outpatient Rx  Name  Route  Sig  Dispense  Refill  . Promethazine HCl (PHENERGAN PO)   Oral   Take by mouth. Family members         . celecoxib (CELEBREX) 200  MG capsule   Oral   Take 200 mg by mouth 2 (two) times daily.         . diazepam (VALIUM) 5 MG tablet   Oral   Take 1 tablet (5 mg total) by mouth every 6 (six) hours as needed (dizziness).   20 tablet   0   . meclizine (ANTIVERT) 50 MG tablet   Oral   Take 0.5 tablets (25 mg total) by mouth 3 (three) times daily as needed for dizziness.   30 tablet   0   . ondansetron (ZOFRAN) 8 MG tablet   Oral   Take 1 tablet (8 mg total) by mouth every 8 (eight) hours as needed for nausea.   20 tablet   0     BP 119/64  Pulse 52  Temp(Src) 98.6 F (37 C) (Oral)  Resp 16  SpO2 100%  LMP 12/28/2011  Physical Exam  Constitutional: She is oriented to person, place, and time. She appears well-developed and well-nourished. No distress.  Pulmonary/Chest: Effort normal.  Neurological: She is alert and oriented to person, place, and time. Gait normal.  Dix-hallpike positive on L side.  Epley maneuvers not successful in relieving vertigo.     ED Course  Procedures (including critical care time)  Labs Reviewed  POCT I-STAT, CHEM 8 - Abnormal; Notable for the following:  Calcium, Ion 1.28 (*)    All other components within normal limits   No results found.   1. Vertigo       MDM  Most likely BPPV or viral labyrinthitis.  Rx meclizine and valium. Pt to f/u with pcp if not better by end of week.          Cathlyn Parsons, NP 05/21/12 [phone]

## 2012-05-21 NOTE — ED Provider Notes (Signed)
Medical screening examination/treatment/procedure(s) were performed by non-physician practitioner and as supervising physician I was immediately available for consultation/collaboration.  Raynald Blend, MD 05/21/12 [phone]

## 2012-09-03 ENCOUNTER — Encounter: Payer: Self-pay | Admitting: *Deleted

## 2012-09-03 ENCOUNTER — Encounter: Payer: Self-pay | Admitting: Obstetrics and Gynecology

## 2012-09-03 ENCOUNTER — Ambulatory Visit (INDEPENDENT_AMBULATORY_CARE_PROVIDER_SITE_OTHER): Payer: BC Managed Care – PPO | Admitting: Obstetrics and Gynecology

## 2012-09-03 VITALS — BP 100/66 | HR 80 | Resp 20 | Ht 66.5 in | Wt 138.0 lb

## 2012-09-03 DIAGNOSIS — R5383 Other fatigue: Secondary | ICD-10-CM

## 2012-09-03 DIAGNOSIS — Z01419 Encounter for gynecological examination (general) (routine) without abnormal findings: Secondary | ICD-10-CM

## 2012-09-03 DIAGNOSIS — Z0000 Encounter for general adult medical examination without abnormal findings: Secondary | ICD-10-CM

## 2012-09-03 LAB — POCT URINALYSIS DIPSTICK
Bilirubin, UA: NEGATIVE
Leukocytes, UA: NEGATIVE
Nitrite, UA: NEGATIVE
Protein, UA: NEGATIVE
Urobilinogen, UA: NEGATIVE
pH, UA: 7

## 2012-09-03 NOTE — Patient Instructions (Addendum)
EXERCISE AND DIET:  We recommended that you start or continue a regular exercise program for good health. Regular exercise means any activity that makes your heart beat faster and makes you sweat.  We recommend exercising at least 30 minutes per day at least 3 days a week, preferably 4 or 5.  We also recommend a diet low in fat and sugar.  Inactivity, poor dietary choices and obesity can cause diabetes, heart attack, stroke, and kidney damage, among others.    ALCOHOL AND SMOKING:  Women should limit their alcohol intake to no more than 7 drinks/beers/glasses of wine (combined, not each!) per week. Moderation of alcohol intake to this level decreases your risk of breast cancer and liver damage. And of course, no recreational drugs are part of a healthy lifestyle.  And absolutely no smoking or even second hand smoke. Most people know smoking can cause heart and lung diseases, but did you know it also contributes to weakening of your bones? Aging of your skin?  Yellowing of your teeth and nails?  CALCIUM AND VITAMIN D:  Adequate intake of calcium and Vitamin D are recommended.  The recommendations for exact amounts of these supplements seem to change often, but generally speaking 600 mg of calcium (either carbonate or citrate) and 800 units of Vitamin D per day seems prudent. Certain women may benefit from higher intake of Vitamin D.  If you are among these women, your doctor will have told you during your visit.    PAP SMEARS:  Pap smears, to check for cervical cancer or precancers,  have traditionally been done yearly, although recent scientific advances have shown that most women can have pap smears less often.  However, every woman still should have a physical exam from her gynecologist every year. It will include a breast check, inspection of the vulva and vagina to check for abnormal growths or skin changes, a visual exam of the cervix, and then an exam to evaluate the size and shape of the uterus and  ovaries.  And after 44 years of age, a rectal exam is indicated to check for rectal cancers. We will also provide age appropriate advice regarding health maintenance, like when you should have certain vaccines, screening for sexually transmitted diseases, bone density testing, colonoscopy, mammograms, etc.   MAMMOGRAMS:  All women over 40 years old should have a yearly mammogram. Many facilities now offer a "3D" mammogram, which may cost around $50 extra out of pocket. If possible,  we recommend you accept the option to have the 3D mammogram performed.  It both reduces the number of women who will be called back for extra views which then turn out to be normal, and it is better than the routine mammogram at detecting truly abnormal areas.       

## 2012-09-03 NOTE — Progress Notes (Signed)
42 y.o.   Married    Caucasian   female   [id number]   here for annual exam.  Pt c/o fatigue.  Her partner Marcelino Duster recently had a moderately severe injury from a mountain biking accident but is ok now.  Her mother also was diagnosed with lobular breast cancer this summer and had lumpectomy and RT and stayed with the pt and her partner for her recovery.  So it has been a very busy stressful summer.  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    Patient's last menstrual period was 12/28/2011.          Sexually active: yes  The current method of family planning is status post hysterectomy.    Exercising: swimming, walking, cycling 3 days a week Last mammogram:  08/19/2010 Last pap smear: 07/28/10 neg History of abnormal pap: no  Smoking: quit in 1998 Alcohol: 2 glasses of wine a week Last colonoscopy: never Last Bone Density:  never Last tetanus shot: 2009 Last cholesterol check: 07/29/09 normal  Hgb:  13.0              Urine: neg   Family History  Problem Relation Age of Onset  . Thyroid disease Mother   . Breast cancer Mother 64    breast cancer    There are no active problems to display for this patient.   Past Medical History  Diagnosis Date  . No pertinent past medical history   . Fibroid 01-2012    Robotic TLH uterus 700g    Past Surgical History  Procedure Laterality Date  . Tonsillectomy    . Uterine ablation    . Robotic assisted total hysterectomy  01/02/2012    Procedure: ROBOTIC ASSISTED TOTAL HYSTERECTOMY;  Surgeon: Alison Murray, MD;  Location: [facility];  Service: Gynecology;  Laterality: N/A;  Roboitc Bilateral Salpingectomy   . Cystoscopy  01/02/2012    Procedure: CYSTOSCOPY;  Surgeon: Alison Murray, MD;  Location: [facility];  Service: Gynecology;  Laterality: N/A;    Allergies: Review of patient's allergies indicates no known allergies.  Current Outpatient Prescriptions  Medication Sig Dispense Refill  . ibuprofen (ADVIL,MOTRIN) 200 MG tablet  Take 200 mg by mouth every 6 (six) hours as needed for pain.      . meclizine (ANTIVERT) 50 MG tablet Take 0.5 tablets (25 mg total) by mouth 3 (three) times daily as needed for dizziness.  30 tablet  0   No current facility-administered medications for this visit.    ROS: Pertinent items are noted in HPI.  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  Exam:    BP 100/66  Pulse 80  Resp 20  Ht 5' 6.5" (1.689 m)  Wt 138 lb (62.596 kg)  BMI 21.94 kg/m2  LMP 12/28/2011 ht stable and wt up 4 pounds from last year  Wt Readings from Last 3 Encounters:  09/03/12 138 lb (62.596 kg)  01/02/12 135 lb (61.236 kg)  01/02/12 135 lb (61.236 kg)     Ht Readings from Last 3 Encounters:  09/03/12 5' 6.5" (1.689 m)  01/02/12 5\' 6"  (1.676 m)  01/02/12 5\' 6"  (1.676 m)    General appearance: alert, cooperative and appears stated age Head: Normocephalic, without obvious abnormality, atraumatic Neck: no adenopathy, supple, symmetrical, trachea midline and thyroid not enlarged, symmetric, no tenderness/mass/nodules Lungs: clear to auscultation bilaterally Breasts: Inspection negative, No  nipple retraction or dimpling, No nipple discharge or bleeding, No axillary or supraclavicular adenopathy, Normal to palpation without dominant masses Heart: regular rate and rhythm Abdomen: soft, non-tender; bowel sounds normal; no masses,  no organomegaly Extremities: extremities normal, atraumatic, no cyanosis or edema Skin: Skin color, texture, turgor normal. No rashes or lesions Lymph nodes: Cervical, supraclavicular, and axillary nodes normal. No abnormal inguinal nodes palpated Neurologic: Grossly normal   Pelvic: External genitalia:  no lesions              Urethra:  normal appearing urethra with no masses, tenderness or lesions              Bartholins and Skenes: normal                 Vagina: normal appearing vagina with normal color and  discharge, two small areas of granulation tissue at cuff but they do not bleed when touched with a scopette and do not bother the patient, so no intervention was done.               Cervix: absent              Pap taken: no        Bimanual Exam:  Uterus:  absent                                      Adnexa: normal adnexa in size, nontender and no masses                                      Rectovaginal: Confirms                                      Anus:  normal sphincter tone, no lesions  A: normal gyn exam     S/p R-TLH/ bilat salpingectomy for fibroids over 700g Dec 2013     Mother with post meno breast cancer; no other FH of breast cancer     P:     Mammogram !!   counseled on breast self exam, mammography screening, adequate intake of calcium and vitamin D, diet and exercise   return annually or prn       Check TSH d/t fatigue, and FLP (routine)   An After Visit Summary was printed and given to the patient.

## 2012-09-04 LAB — LIPID PANEL
LDL Cholesterol: 74 mg/dL (ref 0–99)
Total CHOL/HDL Ratio: 2.6 Ratio
VLDL: 13 mg/dL (ref 0–40)

## 2012-11-22 ENCOUNTER — Other Ambulatory Visit: Payer: Self-pay | Admitting: Obstetrics and Gynecology

## 2012-11-22 DIAGNOSIS — R928 Other abnormal and inconclusive findings on diagnostic imaging of breast: Secondary | ICD-10-CM

## 2012-12-06 ENCOUNTER — Other Ambulatory Visit: Payer: Self-pay

## 2012-12-10 ENCOUNTER — Ambulatory Visit
Admission: RE | Admit: 2012-12-10 | Discharge: 2012-12-10 | Disposition: A | Discharge status: Home / Self Care | Payer: BC Managed Care – PPO | Source: Ambulatory Visit | Attending: Obstetrics and Gynecology | Admitting: Obstetrics and Gynecology

## 2012-12-10 DIAGNOSIS — R928 Other abnormal and inconclusive findings on diagnostic imaging of breast: Secondary | ICD-10-CM

## 2013-06-28 ENCOUNTER — Other Ambulatory Visit: Payer: Self-pay | Admitting: Obstetrics and Gynecology

## 2013-06-28 DIAGNOSIS — N63 Unspecified lump in unspecified breast: Secondary | ICD-10-CM

## 2013-06-28 DIAGNOSIS — R922 Inconclusive mammogram: Secondary | ICD-10-CM

## 2013-07-09 ENCOUNTER — Ambulatory Visit
Admission: RE | Admit: 2013-07-09 | Discharge: 2013-07-09 | Disposition: A | Discharge status: Home / Self Care | Payer: BC Managed Care – PPO | Source: Ambulatory Visit | Attending: Obstetrics and Gynecology | Admitting: Obstetrics and Gynecology

## 2013-07-09 ENCOUNTER — Other Ambulatory Visit: Payer: BC Managed Care – PPO

## 2013-07-09 ENCOUNTER — Other Ambulatory Visit: Payer: Self-pay

## 2013-07-09 DIAGNOSIS — R9230 Dense breasts, unspecified: Secondary | ICD-10-CM

## 2013-07-09 DIAGNOSIS — N63 Unspecified lump in unspecified breast: Secondary | ICD-10-CM

## 2013-07-09 DIAGNOSIS — R922 Inconclusive mammogram: Secondary | ICD-10-CM

## 2013-09-03 ENCOUNTER — Telehealth: Payer: Self-pay | Admitting: Obstetrics and Gynecology

## 2013-09-03 NOTE — Telephone Encounter (Signed)
Confirming pts appt lmtcb

## 2013-09-06 ENCOUNTER — Ambulatory Visit: Payer: BC Managed Care – PPO | Admitting: Obstetrics and Gynecology

## 2015-12-11 ENCOUNTER — Other Ambulatory Visit: Payer: Self-pay | Admitting: Obstetrics

## 2015-12-11 ENCOUNTER — Other Ambulatory Visit: Payer: Self-pay | Admitting: Obstetrics and Gynecology

## 2015-12-11 DIAGNOSIS — N63 Unspecified lump in unspecified breast: Secondary | ICD-10-CM

## 2015-12-16 ENCOUNTER — Ambulatory Visit
Admission: RE | Admit: 2015-12-16 | Discharge: 2015-12-16 | Disposition: A | Discharge status: Home / Self Care | Payer: Managed Care, Other (non HMO) | Source: Ambulatory Visit | Attending: Obstetrics | Admitting: Obstetrics

## 2015-12-16 DIAGNOSIS — N63 Unspecified lump in unspecified breast: Secondary | ICD-10-CM

## 2016-03-26 ENCOUNTER — Encounter (HOSPITAL_COMMUNITY): Payer: Self-pay

## 2016-03-26 ENCOUNTER — Ambulatory Visit (HOSPITAL_COMMUNITY)
Admission: EM | Admit: 2016-03-26 | Discharge: 2016-03-26 | Disposition: A | Discharge status: Nursing Home (Includes ICF) | Payer: Managed Care, Other (non HMO) | Attending: Family Medicine | Admitting: Family Medicine

## 2016-03-26 ENCOUNTER — Encounter (HOSPITAL_COMMUNITY): Payer: Self-pay | Admitting: Emergency Medicine

## 2016-03-26 ENCOUNTER — Ambulatory Visit (INDEPENDENT_AMBULATORY_CARE_PROVIDER_SITE_OTHER): Payer: Managed Care, Other (non HMO)

## 2016-03-26 ENCOUNTER — Emergency Department (HOSPITAL_COMMUNITY)
Admission: EM | Admit: 2016-03-26 | Discharge: 2016-03-26 | Disposition: A | Discharge status: Home / Self Care | Payer: Managed Care, Other (non HMO) | Attending: Emergency Medicine | Admitting: Emergency Medicine

## 2016-03-26 DIAGNOSIS — Y999 Unspecified external cause status: Secondary | ICD-10-CM | POA: Insufficient documentation

## 2016-03-26 DIAGNOSIS — Z87891 Personal history of nicotine dependence: Secondary | ICD-10-CM | POA: Insufficient documentation

## 2016-03-26 DIAGNOSIS — Y9389 Activity, other specified: Secondary | ICD-10-CM | POA: Insufficient documentation

## 2016-03-26 DIAGNOSIS — Z79899 Other long term (current) drug therapy: Secondary | ICD-10-CM | POA: Insufficient documentation

## 2016-03-26 DIAGNOSIS — S62102A Fracture of unspecified carpal bone, left wrist, initial encounter for closed fracture: Secondary | ICD-10-CM

## 2016-03-26 DIAGNOSIS — Y929 Unspecified place or not applicable: Secondary | ICD-10-CM | POA: Insufficient documentation

## 2016-03-26 DIAGNOSIS — S6992XA Unspecified injury of left wrist, hand and finger(s), initial encounter: Secondary | ICD-10-CM | POA: Diagnosis present

## 2016-03-26 NOTE — ED Triage Notes (Signed)
Pt injured left wrist snowboarding yesterday.

## 2016-03-26 NOTE — ED Triage Notes (Signed)
Pt states snowboarding yesterday, fell onto L wrist. Pt complaining of L wrist pain. Pt states seen at [facility], told wrist was broken and to come to ED. Pt with some decreased ROM to L hand. Pt with good cap refill, 2+ pulse.

## 2016-03-26 NOTE — ED Provider Notes (Signed)
[facility] DEPT Provider Note   CSN: [id number] Arrival date & time: 03/26/16  1619     History   Chief Complaint Chief Complaint  Patient presents with  . Wrist Pain  . Fall    HPI Jacari Rebello is a 46 y.o. female.  HPI Anaise Kovatch is a 46 y.o. female presents to emergency department complaining of left wrist injury. Patient states she fell down onto outstretched hand while snowboarding 2 days ago. She was splinted in a Velcro splint care and she chose to wait until she got back to town to be seen. She went to urgent care, had x-rays done there, sent here for possible reduction of the fracture. Patient states her pain is well-controlled with Tylenol and Motrin at home. She has been elevating her wrist. She denies any numbness or tingling in her fingertip. She denies any other injuries. No prior injuries to the left wrist. She is right-handed.  Past Medical History:  Diagnosis Date  . 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 No pertinent past medical history     There are no active problems to display for this patient.   Past Surgical History:  Procedure Laterality Date  . CYSTOSCOPY  01/02/2012   Procedure: CYSTOSCOPY;  Surgeon: Peri Maris, MD;  Location: [facility];  Service: Gynecology;  Laterality: N/A;  . DILATION AND CURETTAGE OF UTERUS    . HYSTEROSCOPY    . ROBOTIC ASSISTED TOTAL HYSTERECTOMY  01/02/2012   Procedure: ROBOTIC ASSISTED TOTAL HYSTERECTOMY;  Surgeon: Peri Maris, MD;  Location: [facility];  Service: Gynecology;  Laterality: N/A;  Roboitc Bilateral Salpingectomy   . TONSILLECTOMY    . uterine ablation      OB History    Gravida Para Term Preterm AB Living   0 0 0 0 0 0   SAB TAB Ectopic Multiple Live Births   0 0 0 0         Home Medications    Prior to Admission medications   Medication Sig Start Date End Date Taking? Authorizing Provider  ibuprofen (ADVIL,MOTRIN) 200 MG tablet Take 200 mg by mouth every 6 (six) hours as  needed for pain.    Historical Provider, MD  meclizine (ANTIVERT) 50 MG tablet Take 0.5 tablets (25 mg total) by mouth 3 (three) times daily as needed for dizziness. 05/21/12   Carvel Getting, NP    Family History Family History  Problem Relation Age of Onset  . Thyroid disease Mother   . Breast cancer Mother 2    breast cancer    Social History Social History  Substance Use Topics  . Smoking status: Former Smoker    Quit date: 02/01/1996  . Smokeless tobacco: Never Used  . Alcohol use 0.5 oz/week    1 Standard drinks or equivalent per week     Comment: occasionally     Allergies   Patient has no known allergies.   Review of Systems Review of Systems  Constitutional: Negative for chills and fever.  Musculoskeletal: Positive for arthralgias and joint swelling.  Neurological: Negative for weakness and numbness.     Physical Exam Updated Vital Signs BP 120/69 (BP Location: Right Arm)   Pulse 64   Temp 98.1 F (36.7 C) (Oral)   Resp 17   Ht 5\' 6"  (1.676 m)   Wt 59 kg   LMP 12/28/2011   SpO2 100%   BMI 20.98 kg/m   Physical Exam  Constitutional: She appears well-developed  and well-nourished. No distress.  Eyes: Conjunctivae are normal.  Neck: Neck supple.  Musculoskeletal:  Mild swelling noted to the left dorsal wrist. Hand appears to be normal, Refill less than 2 seconds distally, sensation is intact in all fingers. Distal radial pulse intact. Pain with range of motion at the wrist joint. Normal elbow.  Neurological: She is alert.  Skin: Skin is warm and dry.  Nursing note and vitals reviewed.    ED Treatments / Results  Labs (all labs ordered are listed, but only abnormal results are displayed) Labs Reviewed - No data to display  EKG  EKG Interpretation None       Radiology Dg Wrist Complete Left  Result Date: 03/26/2016 CLINICAL DATA:  Patient states that she fell during snowboarding yesterday injuring her left wrist trying to break her fall,  pain and swelling. EXAM: LEFT WRIST - COMPLETE 3+ VIEW COMPARISON:  None. FINDINGS: There is a transverse fracture across the distal radial metaphysis, mildly comminuted, with the distal fracture component displaced posteriorly by 3.5 mm. A vertical component of the fracture intersects the distal radial articular surface at the lunate facet. The distal radial articular surface is dorsally angulated by 10 degrees. No other fractures.  The joints are normally spaced and aligned. There is diffuse surrounding soft tissue swelling. IMPRESSION: 1. Transverse fracture of the distal radial metaphysis as described. Electronically Signed   By: Lajean Manes M.D.   On: 03/26/2016 16:22    Procedures Procedures (including critical care time)  Medications Ordered in ED Medications - No data to display   Initial Impression / Assessment and Plan / ED Course  I have reviewed the triage vital signs and the nursing notes.  Pertinent labs & imaging results that were available during my care of the patient were reviewed by me and considered in my medical decision making (see chart for details).     7:20 PM Discussed with Dr. Apolonio Schneiders, who reviewed xrays. No reproduction needed an emergency department. He will schedule patient for surgery tomorrow morning. Will call back with more information. Patient agrees to the plan.  7:53 PM Patient surgery is scheduled for 7:30 tomorrow morning. She is to go to registration/admitting at 6 AM. Nothing to you past midnight. Discussed plan with patient who agrees.  Vitals:   03/26/16 1625 03/26/16 1827 03/26/16 1945 03/26/16 2000  BP: 118/63 120/69 107/75 113/74  Pulse: (!) 55 64 60 63  Resp: 16 17 16 16   Temp: 98.1 F (36.7 C)     TempSrc: Oral     SpO2: 100% 100% 99% 99%  Weight: 59 kg     Height: 5\' 6"  (1.676 m)        Final Clinical Impressions(s) / ED Diagnoses   Final diagnoses:  Closed fracture of left wrist, initial encounter    New  Prescriptions Discharge Medication List as of 03/26/2016  7:55 PM       Jeannett Senior, PA-C 03/26/16 2332    Davonna Belling, MD 03/27/16 2320

## 2016-03-26 NOTE — ED Provider Notes (Signed)
CSN: [id number]     Arrival date & time 03/26/16  1506 History   First MD Initiated Contact with Patient 03/26/16 1604     Chief Complaint  Patient presents with  . Wrist Pain   (Consider location/radiation/quality/duration/timing/severity/associated sxs/prior Treatment) Patient is a well-appearing 46 y.o. Female, injured her left wrist during a fall while snowboarding yesterday. Patient reports pain at the left wrist with limited ROM. Reports swelling at the site a well. Have been putting ice over the area.       Past Medical History:  Diagnosis Date  . 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 No pertinent past medical history    Past Surgical History:  Procedure Laterality Date  . CYSTOSCOPY  01/02/2012   Procedure: CYSTOSCOPY;  Surgeon: Peri Maris, MD;  Location: [facility];  Service: Gynecology;  Laterality: N/A;  . DILATION AND CURETTAGE OF UTERUS    . HYSTEROSCOPY    . ROBOTIC ASSISTED TOTAL HYSTERECTOMY  01/02/2012   Procedure: ROBOTIC ASSISTED TOTAL HYSTERECTOMY;  Surgeon: Peri Maris, MD;  Location: [facility];  Service: Gynecology;  Laterality: N/A;  Roboitc Bilateral Salpingectomy   . TONSILLECTOMY    . uterine ablation     Family History  Problem Relation Age of Onset  . Thyroid disease Mother   . Breast cancer Mother 30    breast cancer   Social History  Substance Use Topics  . Smoking status: Former Smoker    Quit date: 02/01/1996  . Smokeless tobacco: Never Used  . Alcohol use 0.5 oz/week    1 Standard drinks or equivalent per week     Comment: occasionally   OB History    Gravida Para Term Preterm AB Living   0 0 0 0 0 0   SAB TAB Ectopic Multiple Live Births   0 0 0 0       Review of Systems  All other systems reviewed and are negative.   Allergies  Patient has no known allergies.  Home Medications   Prior to Admission medications   Medication Sig Start Date End Date Taking? Authorizing Provider  ibuprofen (ADVIL,MOTRIN) 200 MG  tablet Take 200 mg by mouth every 6 (six) hours as needed for pain.    Historical Provider, MD  meclizine (ANTIVERT) 50 MG tablet Take 0.5 tablets (25 mg total) by mouth 3 (three) times daily as needed for dizziness. 05/21/12   Carvel Getting, NP   Meds Ordered and Administered this Visit  Medications - No data to display  BP 105/64   Pulse (!) 55   Temp 98.4 F (36.9 C) (Oral)   Resp 16   Ht 5\' 6"  (1.676 m)   Wt 130 lb (59 kg)   LMP 12/28/2011   SpO2 100%   BMI 20.98 kg/m  No data found.   Physical Exam  Constitutional: She appears well-developed and well-nourished.  Cardiovascular: Normal rate.   Pulmonary/Chest: Effort normal.  Musculoskeletal:  Right wrist has limited ROM with presence of swelling. Tender to palapte.  Nursing note and vitals reviewed.   Urgent Care Course     Procedures (including critical care time)  Labs Review Labs Reviewed - No data to display  Imaging Review Dg Wrist Complete Left  Result Date: 03/26/2016 CLINICAL DATA:  Patient states that she fell during snowboarding yesterday injuring her left wrist trying to break her fall, pain and swelling. EXAM: LEFT WRIST - COMPLETE 3+ VIEW COMPARISON:  None. FINDINGS: There is a  transverse fracture across the distal radial metaphysis, mildly comminuted, with the distal fracture component displaced posteriorly by 3.5 mm. A vertical component of the fracture intersects the distal radial articular surface at the lunate facet. The distal radial articular surface is dorsally angulated by 10 degrees. No other fractures.  The joints are normally spaced and aligned. There is diffuse surrounding soft tissue swelling. IMPRESSION: 1. Transverse fracture of the distal radial metaphysis as described. Electronically Signed   By: Lajean Manes M.D.   On: 03/26/2016 16:22    MDM   1. Closed fracture of left wrist, initial encounter    Xray shows displaced transverse fracture of the distal radial metaphysis. Given the  severity of the fracture, patient send to ER for further management with the hope for hematoma block with reduction. Patient denies any questions. Patient send via own vehicle.     Barry Dienes, NP 03/26/16 [phone]

## 2016-03-26 NOTE — Discharge Instructions (Signed)
Please report to registration/admitting tomorrow morning at 6 AM at [facility]. Your surgery is scheduled for 7:30 in the morning. Nothing to eat or drink by mouth after midnight.

## 2016-03-27 ENCOUNTER — Ambulatory Visit (HOSPITAL_COMMUNITY)
Admission: AD | Admit: 2016-03-27 | Discharge: 2016-03-27 | Disposition: A | Discharge status: Home / Self Care | Payer: Managed Care, Other (non HMO) | Source: Ambulatory Visit | Attending: Orthopedic Surgery | Admitting: Orthopedic Surgery

## 2016-03-27 ENCOUNTER — Encounter (HOSPITAL_COMMUNITY)
Admission: AD | Disposition: A | Discharge status: Home / Self Care | Payer: Self-pay | Source: Ambulatory Visit | Attending: Orthopedic Surgery

## 2016-03-27 ENCOUNTER — Encounter (HOSPITAL_COMMUNITY): Payer: Self-pay | Admitting: Certified Registered Nurse Anesthetist

## 2016-03-27 ENCOUNTER — Emergency Department (HOSPITAL_COMMUNITY): Admission: EM | Admit: 2016-03-27 | Payer: Managed Care, Other (non HMO) | Source: Home / Self Care

## 2016-03-27 ENCOUNTER — Other Ambulatory Visit (HOSPITAL_BASED_OUTPATIENT_CLINIC_OR_DEPARTMENT_OTHER): Payer: Self-pay | Admitting: Orthopedic Surgery

## 2016-03-27 ENCOUNTER — Ambulatory Visit (HOSPITAL_COMMUNITY): Payer: Managed Care, Other (non HMO) | Admitting: Certified Registered Nurse Anesthetist

## 2016-03-27 DIAGNOSIS — W19XXXA Unspecified fall, initial encounter: Secondary | ICD-10-CM | POA: Insufficient documentation

## 2016-03-27 DIAGNOSIS — Y9323 Activity, snow (alpine) (downhill) skiing, snow boarding, sledding, tobogganing and snow tubing: Secondary | ICD-10-CM | POA: Diagnosis not present

## 2016-03-27 DIAGNOSIS — S52552A Other extraarticular fracture of lower end of left radius, initial encounter for closed fracture: Secondary | ICD-10-CM | POA: Diagnosis present

## 2016-03-27 DIAGNOSIS — Z87891 Personal history of nicotine dependence: Secondary | ICD-10-CM | POA: Diagnosis not present

## 2016-03-27 DIAGNOSIS — S52502A Unspecified fracture of the lower end of left radius, initial encounter for closed fracture: Secondary | ICD-10-CM

## 2016-03-27 DIAGNOSIS — X509XXA Other and unspecified overexertion or strenuous movements or postures, initial encounter: Secondary | ICD-10-CM | POA: Insufficient documentation

## 2016-03-27 HISTORY — PX: OPEN REDUCTION INTERNAL FIXATION (ORIF) DISTAL RADIAL FRACTURE: SHX5989

## 2016-03-27 SURGERY — OPEN REDUCTION INTERNAL FIXATION (ORIF) DISTAL RADIUS FRACTURE
Anesthesia: General | Site: Arm Lower | Laterality: Left

## 2016-03-27 SURGERY — OPEN REDUCTION INTERNAL FIXATION (ORIF) DISTAL RADIUS FRACTURE
Anesthesia: Monitor Anesthesia Care | Site: Wrist | Laterality: Left

## 2016-03-27 MED ORDER — METHOCARBAMOL 500 MG PO TABS: 500.0000 mg | ORAL_TABLET | Freq: Three times a day (TID) | ORAL | 0 refills | Status: DC

## 2016-03-27 MED ORDER — PROPOFOL 10 MG/ML IV BOLUS
INTRAVENOUS | Status: DC | PRN
  Administered 2016-03-27: 10 mg via INTRAVENOUS

## 2016-03-27 MED ORDER — ONDANSETRON HCL 4 MG/2ML IJ SOLN
INTRAMUSCULAR | Status: DC | PRN
  Administered 2016-03-27: 4 mg via INTRAVENOUS

## 2016-03-27 MED ORDER — CEFAZOLIN SODIUM-DEXTROSE 2-4 GM/100ML-% IV SOLN
INTRAVENOUS | Status: AC
  Filled 2016-03-27: qty 100

## 2016-03-27 MED ORDER — MEPERIDINE HCL 25 MG/ML IJ SOLN: 6.2500 mg | INTRAMUSCULAR | Status: DC | PRN

## 2016-03-27 MED ORDER — ONDANSETRON HCL 4 MG/2ML IJ SOLN: 4.0000 mg | Freq: Once | INTRAMUSCULAR | Status: DC | PRN

## 2016-03-27 MED ORDER — FENTANYL CITRATE (PF) 100 MCG/2ML IJ SOLN
INTRAMUSCULAR | Status: DC | PRN
  Administered 2016-03-27 (×2): 50 ug via INTRAVENOUS

## 2016-03-27 MED ORDER — LACTATED RINGERS IV SOLN
INTRAVENOUS | Status: DC | PRN
  Administered 2016-03-27: 07:00:00 via INTRAVENOUS

## 2016-03-27 MED ORDER — BUPIVACAINE HCL (PF) 0.25 % IJ SOLN
INTRAMUSCULAR | Status: AC
Start: 2016-03-27 — End: 2016-03-27
  Filled 2016-03-27: qty 30

## 2016-03-27 MED ORDER — MIDAZOLAM HCL 5 MG/5ML IJ SOLN
INTRAMUSCULAR | Status: DC | PRN
  Administered 2016-03-27: 2 mg via INTRAVENOUS

## 2016-03-27 MED ORDER — ONDANSETRON HCL 4 MG/2ML IJ SOLN
INTRAMUSCULAR | Status: AC
  Filled 2016-03-27: qty 2

## 2016-03-27 MED ORDER — PROPOFOL 10 MG/ML IV BOLUS
INTRAVENOUS | Status: AC
  Filled 2016-03-27: qty 40

## 2016-03-27 MED ORDER — PHENYLEPHRINE 40 MCG/ML (10ML) SYRINGE FOR IV PUSH (FOR BLOOD PRESSURE SUPPORT)
PREFILLED_SYRINGE | INTRAVENOUS | Status: AC
  Filled 2016-03-27: qty 10

## 2016-03-27 MED ORDER — CEFAZOLIN SODIUM-DEXTROSE 2-3 GM-% IV SOLR
INTRAVENOUS | Status: DC | PRN
  Administered 2016-03-27: 2 g via INTRAVENOUS

## 2016-03-27 MED ORDER — PHENYLEPHRINE 40 MCG/ML (10ML) SYRINGE FOR IV PUSH (FOR BLOOD PRESSURE SUPPORT)
PREFILLED_SYRINGE | INTRAVENOUS | Status: DC | PRN
Start: 2016-03-27 — End: 2016-03-27
  Administered 2016-03-27: 80 ug via INTRAVENOUS
  Administered 2016-03-27: 120 ug via INTRAVENOUS
  Administered 2016-03-27: 80 ug via INTRAVENOUS
  Administered 2016-03-27: 40 ug via INTRAVENOUS
  Administered 2016-03-27: 80 ug via INTRAVENOUS

## 2016-03-27 MED ORDER — DOCUSATE SODIUM 100 MG PO CAPS: 100.0000 mg | ORAL_CAPSULE | Freq: Two times a day (BID) | ORAL | 0 refills | Status: DC

## 2016-03-27 MED ORDER — FENTANYL CITRATE (PF) 100 MCG/2ML IJ SOLN
INTRAMUSCULAR | Status: AC
  Filled 2016-03-27: qty 2

## 2016-03-27 MED ORDER — PROPOFOL 500 MG/50ML IV EMUL
INTRAVENOUS | Status: DC | PRN
  Administered 2016-03-27: 75 ug/kg/min via INTRAVENOUS

## 2016-03-27 MED ORDER — HYDROMORPHONE HCL 1 MG/ML IJ SOLN: 0.2500 mg | INTRAMUSCULAR | Status: DC | PRN

## 2016-03-27 MED ORDER — VITAMIN C 500 MG PO TABS: 500.0000 mg | ORAL_TABLET | Freq: Every day | ORAL | 0 refills | Status: DC

## 2016-03-27 MED ORDER — 0.9 % SODIUM CHLORIDE (POUR BTL) OPTIME
TOPICAL | Status: DC | PRN
  Administered 2016-03-27: 1000 mL

## 2016-03-27 MED ORDER — MIDAZOLAM HCL 2 MG/2ML IJ SOLN
INTRAMUSCULAR | Status: AC
  Filled 2016-03-27: qty 2

## 2016-03-27 MED ORDER — LIDOCAINE 2% (20 MG/ML) 5 ML SYRINGE
INTRAMUSCULAR | Status: AC
  Filled 2016-03-27: qty 5

## 2016-03-27 MED ORDER — TRAMADOL HCL 50 MG PO TABS: 50.0000 mg | ORAL_TABLET | Freq: Four times a day (QID) | ORAL | 0 refills | Status: DC | PRN

## 2016-03-27 SURGICAL SUPPLY — 60 items
BANDAGE ACE 4X5 VEL STRL LF (GAUZE/BANDAGES/DRESSINGS) ×3 IMPLANT
BANDAGE ELASTIC 3 VELCRO ST LF (GAUZE/BANDAGES/DRESSINGS) ×3 IMPLANT
BIT DRILL 2 FAST STEP (BIT) ×2 IMPLANT
BIT DRILL 2.5X4 QC (BIT) ×2 IMPLANT
BLADE CLIPPER SURG (BLADE) IMPLANT
BNDG CMPR 9X4 STRL LF SNTH (GAUZE/BANDAGES/DRESSINGS) ×1
BNDG ESMARK 4X9 LF (GAUZE/BANDAGES/DRESSINGS) ×3 IMPLANT
BNDG GAUZE ELAST 4 BULKY (GAUZE/BANDAGES/DRESSINGS) ×3 IMPLANT
CANISTER SUCT 3000ML PPV (MISCELLANEOUS) ×3 IMPLANT
CORDS BIPOLAR (ELECTRODE) ×3 IMPLANT
COVER SURGICAL LIGHT HANDLE (MISCELLANEOUS) ×3 IMPLANT
CUFF TOURNIQUET SINGLE 18IN (TOURNIQUET CUFF) ×3 IMPLANT
CUFF TOURNIQUET SINGLE 24IN (TOURNIQUET CUFF) IMPLANT
DECANTER SPIKE VIAL GLASS SM (MISCELLANEOUS) ×3 IMPLANT
DRAPE OEC MINIVIEW 54X84 (DRAPES) ×3 IMPLANT
DRAPE SURG 17X11 SM STRL (DRAPES) ×3 IMPLANT
DRSG ADAPTIC 3X8 NADH LF (GAUZE/BANDAGES/DRESSINGS) ×3 IMPLANT
GAUZE SPONGE 4X4 12PLY STRL (GAUZE/BANDAGES/DRESSINGS) ×3 IMPLANT
GAUZE SPONGE 4X4 16PLY XRAY LF (GAUZE/BANDAGES/DRESSINGS) ×1 IMPLANT
GLOVE BIOGEL PI IND STRL 8.5 (GLOVE) ×1 IMPLANT
GLOVE BIOGEL PI INDICATOR 8.5 (GLOVE) ×2
GLOVE SURG ORTHO 8.0 STRL STRW (GLOVE) ×3 IMPLANT
GOWN STRL REUS W/ TWL LRG LVL3 (GOWN DISPOSABLE) ×1 IMPLANT
GOWN STRL REUS W/ TWL XL LVL3 (GOWN DISPOSABLE) ×1 IMPLANT
GOWN STRL REUS W/TWL LRG LVL3 (GOWN DISPOSABLE) ×3
GOWN STRL REUS W/TWL XL LVL3 (GOWN DISPOSABLE) ×3
K-WIRE 1.6 (WIRE) ×3
K-WIRE FX5X1.6XNS BN SS (WIRE) ×1
KIT BASIN OR (CUSTOM PROCEDURE TRAY) ×3 IMPLANT
KIT ROOM TURNOVER OR (KITS) ×3 IMPLANT
KWIRE FX5X1.6XNS BN SS (WIRE) IMPLANT
NDL HYPO 25X1 1.5 SAFETY (NEEDLE) ×1 IMPLANT
NEEDLE HYPO 25X1 1.5 SAFETY (NEEDLE) ×3 IMPLANT
NS IRRIG 1000ML POUR BTL (IV SOLUTION) ×3 IMPLANT
PACK ORTHO EXTREMITY (CUSTOM PROCEDURE TRAY) ×3 IMPLANT
PAD ARMBOARD 7.5X6 YLW CONV (MISCELLANEOUS) ×6 IMPLANT
PAD CAST 4YDX4 CTTN HI CHSV (CAST SUPPLIES) ×1 IMPLANT
PADDING CAST COTTON 4X4 STRL (CAST SUPPLIES) ×3
PEG SUBCHONDRAL SMOOTH 2.0X16 (Peg) ×2 IMPLANT
PEG SUBCHONDRAL SMOOTH 2.0X18 (Peg) ×8 IMPLANT
PEG SUBCHONDRAL SMOOTH 2.0X20 (Peg) ×4 IMPLANT
PEG SUBCHONDRAL SMOOTH 2.0X22 (Peg) ×2 IMPLANT
PLATE SHORT 21.6X48.9 NRRW LT (Plate) ×2 IMPLANT
SCREW BN 12X3.5XNS CORT TI (Screw) IMPLANT
SCREW BN 13X3.5XNS CORT TI (Screw) IMPLANT
SCREW CORT 3.5X12 (Screw) ×6 IMPLANT
SCREW CORT 3.5X13 (Screw) ×3 IMPLANT
SLING ARM FOAM STRAP LRG (SOFTGOODS) ×2 IMPLANT
SOAP 2 % CHG 4 OZ (WOUND CARE) ×3 IMPLANT
SPLINT FIBERGLASS 3X35 (CAST SUPPLIES) ×2 IMPLANT
SPONGE LAP 4X18 X RAY DECT (DISPOSABLE) ×1 IMPLANT
SUT VIC AB 2-0 FS1 27 (SUTURE) IMPLANT
SUT VICRYL 4-0 PS2 18IN ABS (SUTURE) IMPLANT
SYR CONTROL 10ML LL (SYRINGE) IMPLANT
TOWEL OR 17X24 6PK STRL BLUE (TOWEL DISPOSABLE) ×3 IMPLANT
TOWEL OR 17X26 10 PK STRL BLUE (TOWEL DISPOSABLE) ×3 IMPLANT
TUBE CONNECTING 12'X1/4 (SUCTIONS) ×1
TUBE CONNECTING 12X1/4 (SUCTIONS) ×2 IMPLANT
WATER STERILE IRR 1000ML POUR (IV SOLUTION) ×3 IMPLANT
YANKAUER SUCT BULB TIP NO VENT (SUCTIONS) IMPLANT

## 2016-03-27 NOTE — Anesthesia Preprocedure Evaluation (Addendum)
Anesthesia Evaluation  Patient identified by MRN, date of birth, ID band Patient awake    Reviewed: Allergy & Precautions, NPO status , Patient's Chart, lab work & pertinent test results  Airway Mallampati: I  TM Distance: >3 FB Neck ROM: Full    Dental  (+) Teeth Intact, Dental Advisory Given   Pulmonary former smoker,    Pulmonary exam normal        Cardiovascular Normal cardiovascular exam     Neuro/Psych    GI/Hepatic   Endo/Other    Renal/GU      Musculoskeletal   Abdominal   Peds  Hematology   Anesthesia Other Findings   Reproductive/Obstetrics                            Anesthesia Physical Anesthesia Plan  ASA: I  Anesthesia Plan: MAC and Regional   Post-op Pain Management:  Regional for Post-op pain   Induction: Intravenous  Airway Management Planned: Natural Airway and Simple Face Mask  Additional Equipment:   Intra-op Plan:   Post-operative Plan:   Informed Consent: I have reviewed the patients History and Physical, chart, labs and discussed the procedure including the risks, benefits and alternatives for the proposed anesthesia with the patient or authorized representative who has indicated his/her understanding and acceptance.   Dental advisory given  Plan Discussed with: CRNA and Surgeon  Anesthesia Plan Comments:        Anesthesia Quick Evaluation

## 2016-03-27 NOTE — H&P (Signed)
Shelly [name] is an 46 y.o. female.   Chief Complaint: left distal radius fracture HPI: Shelly [name] is a 46 y.o. female presents to emergency department complaining of left wrist injury. Patient states she fell down onto outstretched hand while snowboarding 2 days ago. She was splinted in a Velcro splint care and she chose to wait until she got back to town to be seen. She went to urgent care, had x-rays done there, sent here for possible reduction of the fracture. Patient states her pain is well-controlled with Tylenol and Motrin at home. She has been elevating her wrist. She denies any numbness or tingling in her fingertip. She denies any other injuries. No prior injuries to the left wrist. She is right-handed.  Past Medical History:  Diagnosis Date  . 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 No pertinent past medical history     Past Surgical History:  Procedure Laterality Date  . CYSTOSCOPY  01/02/2012   Procedure: CYSTOSCOPY;  Surgeon: Peri Maris, MD;  Location: [facility];  Service: Gynecology;  Laterality: N/A;  . DILATION AND CURETTAGE OF UTERUS    . HYSTEROSCOPY    . ROBOTIC ASSISTED TOTAL HYSTERECTOMY  01/02/2012   Procedure: ROBOTIC ASSISTED TOTAL HYSTERECTOMY;  Surgeon: Peri Maris, MD;  Location: [facility];  Service: Gynecology;  Laterality: N/A;  Roboitc Bilateral Salpingectomy   . TONSILLECTOMY    . uterine ablation      Family History  Problem Relation Age of Onset  . Thyroid disease Mother   . Breast cancer Mother 44    breast cancer   Social History:  reports that she quit smoking about 20 years ago. She has never used smokeless tobacco. She reports that she drinks about 0.5 oz of alcohol per week . She reports that she does not use drugs.  Allergies: No Known Allergies  Medications Prior to Admission  Medication Sig Dispense Refill  . ibuprofen (ADVIL,MOTRIN) 200 MG tablet Take 200 mg by mouth every 6 (six) hours as needed for pain.    . meclizine  (ANTIVERT) 50 MG tablet Take 0.5 tablets (25 mg total) by mouth 3 (three) times daily as needed for dizziness. 30 tablet 0    No results found for this or any previous visit (from the past 48 hour(s)). Dg Wrist Complete Left  Result Date: 03/26/2016 CLINICAL DATA:  Patient states that she fell during snowboarding yesterday injuring her left wrist trying to break her fall, pain and swelling. EXAM: LEFT WRIST - COMPLETE 3+ VIEW COMPARISON:  None. FINDINGS: There is a transverse fracture across the distal radial metaphysis, mildly comminuted, with the distal fracture component displaced posteriorly by 3.5 mm. A vertical component of the fracture intersects the distal radial articular surface at the lunate facet. The distal radial articular surface is dorsally angulated by 10 degrees. No other fractures.  The joints are normally spaced and aligned. There is diffuse surrounding soft tissue swelling. IMPRESSION: 1. Transverse fracture of the distal radial metaphysis as described. Electronically Signed   By: Lajean Manes M.D.   On: 03/26/2016 16:22    ROSNO RECENT ILLNESSES OR HOSPITALIZATIONS  Last menstrual period 12/28/2011. Physical Exam : General Appearance:  Alert, cooperative, no distress, appears stated age  Head:  Normocephalic, without obvious abnormality, atraumatic  Eyes:  Pupils equal, conjunctiva/corneas clear,         Throat: Lips, mucosa, and tongue normal; teeth and gums normal  Neck: No visible masses     Lungs:  respirations unlabored  Chest Wall:  No tenderness or deformity  Heart:  Regular rate and rhythm,  Abdomen:   Soft, non-tender,         Extremities: BLOCK IN PLACE: FINGERS WARM WELL PERFUSED NO OPEN WOUNDS  Pulses: 2+ and symmetric  Skin: Skin color, texture, turgor normal, no rashes or lesions     Neurologic: Normal    Assessment/Plan LEFT DISTAL RADIUS FRACTURE, DISPLACED  LEFT DISTAL RADIUS OPEN REDUCTION AND INTERNAL FIXATION AND REPAIR  R/B/A  DISCUSSED WITH PT IN HOSPITAL  PT VOICED UNDERSTANDING OF PLAN CONSENT SIGNED DAY OF SURGERY PT SEEN AND EXAMINED PRIOR TO OPERATIVE PROCEDURE/DAY OF SURGERY SITE MARKED. QUESTIONS ANSWERED WILL GO HOME FOLLOWING SURGERY  WE ARE PLANNING SURGERY FOR YOUR UPPER EXTREMITY. THE RISKS AND BENEFITS OF SURGERY INCLUDE BUT NOT LIMITED TO BLEEDING INFECTION, DAMAGE TO NEARBY NERVES ARTERIES TENDONS, FAILURE OF SURGERY TO ACCOMPLISH ITS INTENDED GOALS, PERSISTENT SYMPTOMS AND NEED FOR FURTHER SURGICAL INTERVENTION. WITH THIS IN MIND WE WILL PROCEED. I HAVE DISCUSSED WITH THE PATIENT THE PRE AND POSTOPERATIVE REGIMEN AND THE DOS AND DON'TS. PT VOICED UNDERSTANDING AND INFORMED CONSENT SIGNED.  Linna Hoff 03/27/2016, 7:57 AM

## 2016-03-27 NOTE — Transfer of Care (Signed)
Immediate Anesthesia Transfer of Care Note  Patient: Shelly [name]  Procedure(s) Performed: Procedure(s): OPEN REDUCTION INTERNAL FIXATION (ORIF) DISTAL RADIAL FRACTURE (Left)  Patient Location: PACU  Anesthesia Type:MAC and Regional  Level of Consciousness: awake, alert  and oriented  Airway & Oxygen Therapy: Patient Spontanous Breathing  Post-op Assessment: Report given to RN, Post -op Vital signs reviewed and stable and Patient moving all extremities X 4  Post vital signs: Reviewed and stable  Last Vitals: There were no vitals filed for this visit.  Last Pain: There were no vitals filed for this visit.       Complications: No apparent anesthesia complications

## 2016-03-27 NOTE — Anesthesia Procedure Notes (Signed)
Procedure Name: MAC Date/Time: 03/27/2016 7:55 AM Performed by: Garrison Columbus T Pre-anesthesia Checklist: Patient identified, Emergency Drugs available, Suction available and Patient being monitored Patient Re-evaluated:Patient Re-evaluated prior to inductionOxygen Delivery Method: Simple face mask Preoxygenation: Pre-oxygenation with 100% oxygen Intubation Type: IV induction Placement Confirmation: positive ETCO2 and breath sounds checked- equal and bilateral Dental Injury: Teeth and Oropharynx as per pre-operative assessment

## 2016-03-27 NOTE — Op Note (Signed)
[name], [name]             ACCOUNT NO.:  [id number]  MEDICAL RECORD NO.:  [id number]  LOCATION:  [facility]                         FACILITY:  [facility]  PHYSICIAN:  Linna Hoff IV, M.D.DATE OF BIRTH:  1970-10-16  DATE OF PROCEDURE:  03/27/2016 DATE OF DISCHARGE:  03/27/2016                              OPERATIVE REPORT   PREOPERATIVE DIAGNOSIS:  Displaced left extra-articular distal radius fracture.  POSTOPERATIVE DIAGNOSIS:  Displaced left extra-articular distal radius fracture.  ATTENDING PHYSICIAN:  Linna Hoff, M.D., who was scrubbed and present for the entire procedure.  ASSISTANT SURGEON:  None.  ANESTHESIA:  Supraclavicular block with IV sedation.  SURGICAL PROCEDURE: 1. Open treatment of left wrist extra-articular distal radius fracture     with internal fixation. 2. Left wrist brachioradialis tendon release and tendon tenotomy. 3. Radiograph 3 views, left wrist.  RADIOGRAPHIC INTERPRETATION:  AP, lateral, and oblique views of the wrist did show the volar plate fixation placed in good position.  With good restoration of radial height, inclination, and tilt.  SURGICAL INDICATIONS:  Shelly [name] is a 46 year old right-hand-dominant female, who unfortunately sustained a closed injury to her left wrist falling while snowboarding.  The patient had a displaced distal radius fracture.  It was recommended she undergo the above procedure.  Risks, benefits, and alternatives were discussed in detail with the patient. Signed informed consent was obtained.  Risks include, but not limited to bleeding, infection, damage to nearby nerves, arteries, or tendons; nonunion, malunion, hardware failure, loss of motion of the wrist and digits, incomplete relief of symptoms, and need for further surgical intervention.  SURGICAL IMPLANTS:  [employer] DVR narrow plate.  DESCRIPTION OF PROCEDURE:  The patient was properly identified in the preoperative holding area and  marked with a permanent marker in left wrist to indicate the correct operative site.  The patient was brought back to the operating room, placed supine on the anesthesia table where the IV sedation was administered.  The patient tolerated the block well. A well-padded tourniquet was placed on left brachium, sealed with a 1000 drape.  The left upper extremity was then prepped and draped in normal sterile fashion.  Time-out was called, the correct side was identified, and the procedure begun.  Attention was then turned to the left wrist. A longitudinal incision was made directly over the FCR sheath. Dissection was then carried down through the skin and subcutaneous tissue.  The FCR sheath was then opened proximally and distally. Hemostasis of crossing veins were then cauterized with a bipolar cautery.  Deep dissection was carried down to the floor of the FCR sheath where the FPL was then swept out of the way and the pronator quadratus was then elevated in an L-shaped fashion.  Extra-articular fracture was then opened and reduced.  In order to reduce the radial column and get fracture back up length.  Brachioradialis was then carefully released off the radial styloid.  Tendon tenotomy was then carried out releasing the forearm flexor.  The wound was then thoroughly irrigated.  The volar plate and the narrow DVR [employer] plate was then used.  It was held distally with a K-wire, the plate position was then confirmed.  The  oblong screw hole was then placed proximally with a 2.5 mm drill bit and the 3.5 screw was then placed.  Plate height was then adjusted.  Then distal fixation was then carried out from an ulnar to radial direction with the distal locking pegs.  Final shaft fixation was then carried out with 2 more bicortical 3.5 screws.  The wound was irrigated.  Final radiographs were then obtained.  Stress radiography did not reveal any incompetence of the FCL ligament.  There was  good stability.  No widening of the FCL interval.  No irregularity in the distal radioulnar joint.  The pronator quadratus was then closed with 2-0 Vicryl.  Subcutaneous tissue was closed with 4-0 Vicryl, and skin closed with simple 4-0 Prolene.  Adaptic dressing, sterile compressive bandage were applied.  The patient was then placed in a well- padded sugar-tong splint and taken recovery room in good condition.  POSTPROCEDURE PLAN:  The patient is discharged home.  We will see her back office in approximately 2 weeks for wound check, suture removal, x- rays out of the splint, application of a short-arm cast, put in therapy order for the 4-week mark, begin outpatient therapy regimen at the 4- week mark.  Radiographs at each visit.     Shelly [name], M.D.     FWO/MEDQ  D:  03/27/2016  T:  03/27/2016  Job:  [id number]

## 2016-03-27 NOTE — Anesthesia Postprocedure Evaluation (Signed)
Anesthesia Post Note  Patient: Herbalist  Procedure(s) Performed: Procedure(s) (LRB): OPEN REDUCTION INTERNAL FIXATION (ORIF) DISTAL RADIAL FRACTURE (Left)  Patient location during evaluation: PACU Anesthesia Type: Regional Level of consciousness: awake and alert and patient cooperative Pain management: pain level controlled Vital Signs Assessment: post-procedure vital signs reviewed and stable Respiratory status: spontaneous breathing and respiratory function stable Cardiovascular status: stable Anesthetic complications: no       Last Vitals:  Vitals:   03/27/16 0936 03/27/16 0951  BP: 103/68 98/69  Pulse: (!) 56 (!) 50  Resp: 13 17  Temp:      Last Pain:  Vitals:   03/27/16 0907  PainSc: 0-No pain                 Eduardo Wurth DAVID

## 2016-03-27 NOTE — Discharge Instructions (Signed)
KEEP BANDAGE CLEAN AND DRY CALL OFFICE FOR F/U APPT [phone] in 2 weeks DR [facility] CELL [phone] KEEP HAND ELEVATED ABOVE HEART OK TO APPLY ICE TO OPERATIVE AREA CONTACT OFFICE IF ANY WORSENING PAIN OR CONCERNS.

## 2016-03-27 NOTE — Anesthesia Procedure Notes (Signed)
Anesthesia Regional Block: Supraclavicular block   Pre-Anesthetic Checklist: ,, timeout performed, Correct Patient, Correct Site, Correct Laterality, Correct Procedure, Correct Position, site marked, Risks and benefits discussed,  Surgical consent,  Pre-op evaluation,  At surgeon's request and post-op pain management  Laterality: Left  Prep: chloraprep       Needles:   Needle Type: Echogenic Stimulator Needle     Needle Length: 9cm  Needle Gauge: 21     Additional Needles:   Procedures: ultrasound guided, nerve stimulator,,,,,,   Nerve Stimulator or Paresthesia:  Response: 0.4 mA,   Additional Responses:   Narrative:  Start time: 03/27/2016 7:20 AM End time: 03/27/2016 7:30 AM Injection made incrementally with aspirations every 5 mL.  Performed by: Personally  Anesthesiologist: Lillia Abed  Additional Notes: Monitors applied. Patient sedated. Sterile prep and drape,hand hygiene and sterile gloves were used. Relevant anatomy identified.Needle position confirmed.Local anesthetic injected incrementally after negative aspiration. Local anesthetic spread visualized around nerve(s). Vascular puncture avoided. No complications. Image printed for medical record.The patient tolerated the procedure well.

## 2016-03-27 NOTE — Brief Op Note (Signed)
JOB ID# [id number] LEFT DISTAL RADIUS ORIF HOME TODAY F/U IN OFFICE IN 14 DAYS

## 2016-03-28 ENCOUNTER — Encounter (HOSPITAL_COMMUNITY): Payer: Self-pay | Admitting: Orthopedic Surgery

## 2016-04-05 NOTE — Progress Notes (Signed)
[name] [place], Shelly [name] [address] Phone: [phone] Subjective:    I'm seeing this patient by the request  of:    CC: Foot pain after cycling  [id number]  Shelly [name] is a 46 y.o. female coming in with complaint of foot pain after cycling. Patient has been cycling a lot more recently. Feels that she isn't giving her trouble. Pain is more on the forefoot. Patient states that there has been swelling on the ball of the foot. Patient denies any numbness. States though that it can be sore with activity including now walking. Patient rates the severity pain as 5 out of 10 and worsening.   patient also recently had an open reduction internal fixation of the distal radial fracture on the left wrist 03/27/2016.  Past Medical History:  Diagnosis Date  . 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 No pertinent past medical history    Past Surgical History:  Procedure Laterality Date  . CYSTOSCOPY  01/02/2012   Procedure: CYSTOSCOPY;  Surgeon: Peri Maris, MD;  Location: [facility];  Service: Gynecology;  Laterality: N/A;  . DILATION AND CURETTAGE OF UTERUS    . HYSTEROSCOPY    . OPEN REDUCTION INTERNAL FIXATION (ORIF) DISTAL RADIAL FRACTURE Left 03/27/2016   Procedure: OPEN REDUCTION INTERNAL FIXATION (ORIF) DISTAL RADIAL FRACTURE;  Surgeon: Iran Planas, MD;  Location: [facility];  Service: Orthopedics;  Laterality: Left;  . ROBOTIC ASSISTED TOTAL HYSTERECTOMY  01/02/2012   Procedure: ROBOTIC ASSISTED TOTAL HYSTERECTOMY;  Surgeon: Peri Maris, MD;  Location: [facility];  Service: Gynecology;  Laterality: N/A;  Roboitc Bilateral Salpingectomy   . TONSILLECTOMY    . uterine ablation     Social History   Social History  . Marital status: Married    Spouse name: N/A  . Number of children: N/A  . Years of education: N/A   Social History Main Topics  . Smoking status: Former Smoker    Quit date: 02/01/1996  . Smokeless tobacco: Never Used   . Alcohol use 0.5 oz/week    1 Standard drinks or equivalent per week     Comment: occasionally  . Drug use: No  . Sexual activity: Yes    Partners: Female    Birth control/ protection: None   Other Topics Concern  . None   Social History Narrative  . None   No Known Allergies Family History  Problem Relation Age of Onset  . Thyroid disease Mother   . Breast cancer Mother 1    breast cancer    Past medical history, social, surgical and family history all reviewed in electronic medical record.  No pertanent information unless stated regarding to the chief complaint.   Review of Systems:Review of systems updated and as accurate as of 04/06/16  No headache, visual changes, nausea, vomiting, diarrhea, constipation, dizziness, abdominal pain, skin rash, fevers, chills, night sweats, weight loss, swollen lymph nodes, body aches, joint swelling, muscle aches, chest pain, shortness of breath, mood changes.   Objective  Blood pressure 108/78, pulse 61, height 5\' 6"  (1.676 m), weight 133 lb (60.3 kg), last menstrual period 12/28/2011, SpO2 98 %. Systems examined below as of 04/06/16   General: No apparent distress alert and oriented x3 mood and affect normal, dressed appropriately.  HEENT: Pupils equal, extraocular movements intact  Respiratory: Patient's speak in full sentences and does not appear short of breath  Cardiovascular: No lower extremity edema, non tender, no erythema  Skin: Warm dry intact with no signs of infection or rash on extremities or on axial skeleton.  Abdomen: Soft nontender  Neuro: Cranial nerves II through XII are intact, neurovascularly intact in all extremities with 2+ DTRs and 2+ pulses.  Lymph: No lymphadenopathy of posterior or anterior cervical chain or axillae bilaterally.  Gait normal with good balance and coordination.  MSK:  Non tender with full range of motion and good stability and symmetric strength and tone of shoulders, elbows, wrist, hip, knee  and ankles bilaterally.  Right foot exam shows the patient does have some mild swelling of the head of the second metatarsal. On the plantar aspect. Patient does have callus formation noted. Breakdown of the transverse arch with splaying between the first and second toes. Mild bunion and bunionette formation. Otherwise neutral, thin foot.  Limited Musculoskeletal ultrasound was performed and interpreted by Lyndal Pulley  Limited ultrasound shows the patient does have what appears to be a cortical defect of the sesamoid bone. Patient does have a accessory bone over the second metatarsal head that does seem to have fractured. Increasing Doppler flow and hypoechoic changes. Reviewing the contralateral side no bony normality on that side. Impression: Fracture of accessory sesamoid bone of the second metatarsal head.   Impression and Recommendations:     This case required medical decision making of moderate complexity.      Note: This dictation was prepared with Dragon dictation along with smaller phrase technology. Any transcriptional errors that result from this process are unintentional.

## 2016-04-06 ENCOUNTER — Ambulatory Visit (INDEPENDENT_AMBULATORY_CARE_PROVIDER_SITE_OTHER): Payer: Managed Care, Other (non HMO) | Admitting: Family Medicine

## 2016-04-06 ENCOUNTER — Other Ambulatory Visit: Payer: Self-pay

## 2016-04-06 ENCOUNTER — Ambulatory Visit: Payer: Self-pay

## 2016-04-06 ENCOUNTER — Encounter: Payer: Self-pay | Admitting: Family Medicine

## 2016-04-06 VITALS — BP 108/78 | HR 61 | Ht 66.0 in | Wt 133.0 lb

## 2016-04-06 DIAGNOSIS — M79671 Pain in right foot: Secondary | ICD-10-CM

## 2016-04-06 DIAGNOSIS — S92811A Other fracture of right foot, initial encounter for closed fracture: Secondary | ICD-10-CM

## 2016-04-06 DIAGNOSIS — S92819A Other fracture of unspecified foot, initial encounter for closed fracture: Secondary | ICD-10-CM | POA: Insufficient documentation

## 2016-04-06 MED ORDER — VITAMIN D (ERGOCALCIFEROL) 1.25 MG (50000 UNIT) PO CAPS: 50000.0000 [IU] | ORAL_CAPSULE | ORAL | 0 refills | Status: DC

## 2016-04-06 NOTE — Assessment & Plan Note (Signed)
She does have a fracture of the sesamoid bone. This seems to be an accessory bone. Patient will do more rigid soles shoes, once weekly vitamin D, we discussed icing regimen. Discussed which activities to avoid. Patient likely will heal but it will take some more between 3 and 6 weeks. Encourage her to do more low impact exercises. We discussed proper shoe changes. Follow-up again in 3-4 weeks.

## 2016-04-06 NOTE — Patient Instructions (Addendum)
Great to meet you  Small fracture of sesmoid bone.  Ice is your friend. Possibly ice bath at night Stay active but lets stay upright;) Avoid being barefoot.  Rigid sole shoe.  Once weekly vitamin D for 12 weeks.  Ok to bike but maybe a little easier.  See me again in 3 weeks.

## 2016-04-26 NOTE — Progress Notes (Signed)
[name] [place], [place] [address] Phone: [phone] Subjective:     CC: Foot pain after cycling f/u  [id number]  Shelly [name] is a 46 y.o. female coming in with complaint of foot pain after cycling. Patient was found to have a fracture of the sesamoid bone. Patient was to avoid certain activities and we discussed proper shoes. Once weekly vitamin D was given. Patient states She is doing much better. States that she is feeling very good until she started to bike again a little bit. Has been transition into shoes. Taking the vitamin D regularly. Would state that she is feeling 60-70% better.   patient also recently had an open reduction internal fixation of the distal radial fracture on the left wrist 03/27/2016.  Past Medical History:  Diagnosis Date  . 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 No pertinent past medical history    Past Surgical History:  Procedure Laterality Date  . CYSTOSCOPY  01/02/2012   Procedure: CYSTOSCOPY;  Surgeon: Peri Maris, MD;  Location: [facility];  Service: Gynecology;  Laterality: N/A;  . DILATION AND CURETTAGE OF UTERUS    . HYSTEROSCOPY    . OPEN REDUCTION INTERNAL FIXATION (ORIF) DISTAL RADIAL FRACTURE Left 03/27/2016   Procedure: OPEN REDUCTION INTERNAL FIXATION (ORIF) DISTAL RADIAL FRACTURE;  Surgeon: Iran Planas, MD;  Location: [facility];  Service: Orthopedics;  Laterality: Left;  . ROBOTIC ASSISTED TOTAL HYSTERECTOMY  01/02/2012   Procedure: ROBOTIC ASSISTED TOTAL HYSTERECTOMY;  Surgeon: Peri Maris, MD;  Location: [facility];  Service: Gynecology;  Laterality: N/A;  Roboitc Bilateral Salpingectomy   . TONSILLECTOMY    . uterine ablation     Social History   Social History  . Marital status: Married    Spouse name: N/A  . Number of children: N/A  . Years of education: N/A   Social History Main Topics  . Smoking status: Former Smoker    Quit date: 02/01/1996  . Smokeless tobacco:  Never Used  . Alcohol use 0.5 oz/week    1 Standard drinks or equivalent per week     Comment: occasionally  . Drug use: No  . Sexual activity: Yes    Partners: Female    Birth control/ protection: None   Other Topics Concern  . None   Social History Narrative  . None   No Known Allergies Family History  Problem Relation Age of Onset  . Thyroid disease Mother   . Breast cancer Mother 88    breast cancer    Past medical history, social, surgical and family history all reviewed in electronic medical record.  No pertanent information unless stated regarding to the chief complaint.   Review of Systems: No headache, visual changes, nausea, vomiting, diarrhea, constipation, dizziness, abdominal pain, skin rash, fevers, chills, night sweats, weight loss, swollen lymph nodes, body aches, joint swelling, muscle aches, chest pain, shortness of breath, mood changes.    Objective  Blood pressure 102/70, pulse 80, height 5\' 6"  (1.676 m), weight 131 lb 6.4 oz (59.6 kg), last menstrual period 12/28/2011, SpO2 99 %.   Systems examined below as of 04/27/16 General: NAD A&O x3 mood, affect normal  HEENT: Pupils equal, extraocular movements intact no nystagmus Respiratory: not short of breath at rest or with speaking Cardiovascular: No lower extremity edema, non tender Skin: Warm dry intact with no signs of infection or rash on extremities or on axial skeleton. Abdomen: Soft nontender, no masses  Neuro: Cranial nerves  intact, neurovascularly intact in all extremities with 2+ DTRs and 2+ pulses. Lymph: No lymphadenopathy appreciated today  Gait normal with good balance and coordination.  MSK: Non tender with full range of motion and good stability and symmetric strength and tone of shoulders, elbows, wrist,  knee hips and ankles bilaterally.   Right foot exam shows the patient does have No swelling noted today though. Patient still minimally tender over the plantar aspect of the second  metatarsal. Same breakdown of foot noted previously of transverse arch same breakdown of the foot as previously stated..    Limited Musculoskeletal ultrasound was performed and interpreted by Lyndal Pulley  Limited ultrasound shows the patient does have what appears to be a cortical defect of the sesamoid bone. patient though does have a callus formation and very mild what appears to be bursitis even more superficial to this area. No increasing in Doppler flow.  Impression: Fracture of accessory sesamoid bone of the second metatarsal head with interval healing. Patient does have a reactive bursitis noted.   Impression and Recommendations:          Note: This dictation was prepared with Dragon dictation along with smaller phrase technology. Any transcriptional errors that result from this process are unintentional.

## 2016-04-27 ENCOUNTER — Ambulatory Visit (INDEPENDENT_AMBULATORY_CARE_PROVIDER_SITE_OTHER): Payer: Managed Care, Other (non HMO) | Admitting: Family Medicine

## 2016-04-27 ENCOUNTER — Ambulatory Visit: Payer: Self-pay

## 2016-04-27 ENCOUNTER — Encounter: Payer: Self-pay | Admitting: Family Medicine

## 2016-04-27 VITALS — BP 102/70 | HR 80 | Ht 66.0 in | Wt 131.4 lb

## 2016-04-27 DIAGNOSIS — S92811D Other fracture of right foot, subsequent encounter for fracture with routine healing: Secondary | ICD-10-CM | POA: Diagnosis not present

## 2016-04-27 DIAGNOSIS — M79671 Pain in right foot: Secondary | ICD-10-CM | POA: Diagnosis not present

## 2016-04-27 NOTE — Assessment & Plan Note (Signed)
Seems to be healing. Callus formation already noted. We discussed continuing the rigid shoes, icing regimen, the vitamin D, patient will do more low weightbearing exercises. Follow-up again in 3 weeks.

## 2016-04-27 NOTE — Patient Instructions (Signed)
God to see you  You are doing great  Now that you can swim try to do that a little more.  Ice is your friend.  Continue what you are doing  Increase biking again in week 3  See me one more time in 3 weeks to make sure completely healed.

## 2016-05-17 NOTE — Progress Notes (Signed)
[name] [place], [place] [address] Phone: [phone] Subjective:     CC: Foot pain after cycling f/u  [id number]  Shelly [name] is a 46 y.o. female coming in with complaint of foot pain after cycling. Patient was found to have a fracture of the sesamoid bone. Patient has made significant improvement. Patient states that no longer hurts her with walking or any type of biking. Patient only has some discomfort when she was going up or down hills. Patient denies any numbness, any radiation of pain, any swelling. Overall fairly happy. Once to get back to running soon.   patient also recently had an open reduction internal fixation of the distal radial fracture on the left wrist 03/27/2016.  Past Medical History:  Diagnosis Date  . 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 No pertinent past medical history    Past Surgical History:  Procedure Laterality Date  . CYSTOSCOPY  01/02/2012   Procedure: CYSTOSCOPY;  Surgeon: Peri Maris, MD;  Location: [facility];  Service: Gynecology;  Laterality: N/A;  . DILATION AND CURETTAGE OF UTERUS    . HYSTEROSCOPY    . OPEN REDUCTION INTERNAL FIXATION (ORIF) DISTAL RADIAL FRACTURE Left 03/27/2016   Procedure: OPEN REDUCTION INTERNAL FIXATION (ORIF) DISTAL RADIAL FRACTURE;  Surgeon: Iran Planas, MD;  Location: [facility];  Service: Orthopedics;  Laterality: Left;  . ROBOTIC ASSISTED TOTAL HYSTERECTOMY  01/02/2012   Procedure: ROBOTIC ASSISTED TOTAL HYSTERECTOMY;  Surgeon: Peri Maris, MD;  Location: [facility];  Service: Gynecology;  Laterality: N/A;  Roboitc Bilateral Salpingectomy   . TONSILLECTOMY    . uterine ablation     Social History   Social History  . Marital status: Married    Spouse name: N/A  . Number of children: N/A  . Years of education: N/A   Social History Main Topics  . Smoking status: Former Smoker    Quit date: 02/01/1996  . Smokeless tobacco: Never Used  . Alcohol use 0.5  oz/week    1 Standard drinks or equivalent per week     Comment: occasionally  . Drug use: No  . Sexual activity: Yes    Partners: Female    Birth control/ protection: None   Other Topics Concern  . None   Social History Narrative  . None   No Known Allergies Family History  Problem Relation Age of Onset  . Thyroid disease Mother   . Breast cancer Mother 55    breast cancer    Past medical history, social, surgical and family history all reviewed in electronic medical record.  No pertanent information unless stated regarding to the chief complaint.   Review of Systems: No headache, visual changes, nausea, vomiting, diarrhea, constipation, dizziness, abdominal pain, skin rash, fevers, chills, night sweats, weight loss, swollen lymph nodes, body aches, joint swelling, muscle aches, chest pain, shortness of breath, mood changes.     Objective  Blood pressure 108/64, pulse (!) 54, resp. rate 16, weight 134 lb 6 oz (61 kg), last menstrual period 12/28/2011, SpO2 97 %.   Systems examined below as of 05/18/16 General: NAD A&O x3 mood, affect normal  HEENT: Pupils equal, extraocular movements intact no nystagmus Respiratory: not short of breath at rest or with speaking Cardiovascular: No lower extremity edema, non tender Skin: Warm dry intact with no signs of infection or rash on extremities or on axial skeleton. Abdomen: Soft nontender, no masses Neuro: Cranial nerves  intact, neurovascularly  intact in all extremities with 2+ DTRs and 2+ pulses. Lymph: No lymphadenopathy appreciated today  Gait normal with good balance and coordination.  MSK: Non tender with full range of motion and good stability and symmetric strength and tone of shoulders, elbows, wrist,  knee hips and ankles bilaterally.   Right foot exam shows the patient does have No swelling noted today though.Patient is nontender at all on exam. Then foot still noted. Mild splaying between the first and second toes of the  mild breakdown of the transverse arch.  Limited Musculoskeletal ultrasound was performed and interpreted by Lyndal Pulley  Limited ultrasound shows reabsorption. Seems to be doing very well. No increasing Doppler flow or hypoechoic changes. Impression: Healing stress fracture  Impression and Recommendations:          Note: This dictation was prepared with Dragon dictation along with smaller phrase technology. Any transcriptional errors that result from this process are unintentional.

## 2016-05-18 ENCOUNTER — Ambulatory Visit (INDEPENDENT_AMBULATORY_CARE_PROVIDER_SITE_OTHER): Payer: Managed Care, Other (non HMO) | Admitting: Family Medicine

## 2016-05-18 ENCOUNTER — Encounter: Payer: Self-pay | Admitting: Family Medicine

## 2016-05-18 DIAGNOSIS — R269 Unspecified abnormalities of gait and mobility: Secondary | ICD-10-CM | POA: Insufficient documentation

## 2016-05-18 DIAGNOSIS — S92811D Other fracture of right foot, subsequent encounter for fracture with routine healing: Secondary | ICD-10-CM | POA: Diagnosis not present

## 2016-05-18 NOTE — Assessment & Plan Note (Signed)
Healed fracture at this time. Discussed the possibility of custom orthotics with her being nearly pain-free on exam today. Finish with the once weekly vitamin D. Running progression given. Follow-up 4 weeks after the orthotics.

## 2016-05-18 NOTE — Progress Notes (Signed)
Pre-visit discussion using our clinic review tool. No additional management support is needed unless otherwise documented below in the visit note.  

## 2016-05-18 NOTE — Patient Instructions (Signed)
God to see you  If you look into [facility] consider clifton or bondi 5 [place] could be great at off and running After [employer] a walk-run progression:  Up to 3 minutes  30 mins: - Run 2 mins, then walk 1 min. -Then run 3 mins, and walk 1 min. -Then run 4 mins, and walk 1 min. -Then run 5 mins, and walk 1 min. -Slowly build up weekly to running 30 mins nonstop. 5% incline on the treadmill increase 2% a week.    If painful at any of the steps, back up one step.  We will call you when we get the orthotics in.

## 2016-05-30 ENCOUNTER — Ambulatory Visit: Payer: Managed Care, Other (non HMO) | Admitting: Family Medicine

## 2016-05-30 NOTE — Progress Notes (Signed)
Procedure Note   Patient was fitted for a : control, standard, cushioned, semi-rigid orthotic. The orthotic was heated and afterward the patient patient seated position and molded The patient was positioned in subtalar neutral position and 10 degrees of ankle dorsiflexion in a weight bearing stance. After completion of molding, patient did have orthotic management The blank was ground to a stable position for weight bearing. Size: 8.5 women's Base: Carbon fiber Additional Posting and Padding: Lateral posting 250/60 bilaterally, Transverse arch postings: left 250/35, right 250/60 The patient ambulated these, and they were very comfortable.

## 2016-05-30 NOTE — Progress Notes (Deleted)
Patient was fitted for a : All-around standard, cushioned, semi-rigid orthotic. The orthotic was heated and afterward the patient was in a seated position and the orthotic molded. The patient was positioned in subtalar neutral position and 10 degrees of ankle dorsiflexion in a non-weight bearing stance. After completion of molding, patient did have orthotic management which included instructions on acclimating to the orthotics, signs of ill fit as well as care for the orthotic.   The blank was ground to a stable position for weight bearing. Size: Control  Base: Carbon fiber Additional Posting and Padding: The following postings were fitted onto the molded orthotics to help maintain a talar neutral position - Wedge posting for transverse arch:        Silicone posting for longitudinal arch:  The patient ambulated these, and they were very comfortable and supportive.

## 2016-05-31 ENCOUNTER — Encounter: Payer: Self-pay | Admitting: Family Medicine

## 2016-05-31 ENCOUNTER — Ambulatory Visit (INDEPENDENT_AMBULATORY_CARE_PROVIDER_SITE_OTHER): Payer: Managed Care, Other (non HMO) | Admitting: Family Medicine

## 2016-05-31 DIAGNOSIS — R2689 Other abnormalities of gait and mobility: Secondary | ICD-10-CM | POA: Diagnosis not present

## 2016-05-31 DIAGNOSIS — R269 Unspecified abnormalities of gait and mobility: Secondary | ICD-10-CM

## 2016-05-31 NOTE — Assessment & Plan Note (Signed)
Patient was fitted for custom orthotics today. Patient tolerated the procedure well. Patient knows to slowly increase wear over the course of time. We can make adjustments as needed. Patient will follow-up with me again 2 to 4 weeks after.

## 2017-03-01 NOTE — Progress Notes (Signed)
[name] [address] [place], [place] [address] Phone: [phone] Subjective:    I'm seeing this patient by the request  of:    CC: Right elbow pain  [id number]  Shelly [name] is a 47 y.o. female coming in right elbow pain. Most of her pain is on the outside of the elbow and started 3 weeks ago. She does golf and has changed her swing. She was unable to pick up a coffee cup without pain recently.  Patient thinks he may have came from golf.  Patient denies any weakness but states significant soreness.  Seems to be even sore in the morning.  Rates the severity of pain is 7 out of 10.  Massage has been somewhat helpful       Past Medical History:  Diagnosis Date  . 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 No pertinent past medical history    Past Surgical History:  Procedure Laterality Date  . CYSTOSCOPY  01/02/2012   Procedure: CYSTOSCOPY;  Surgeon: Peri Maris, MD;  Location: [facility];  Service: Gynecology;  Laterality: N/A;  . DILATION AND CURETTAGE OF UTERUS    . HYSTEROSCOPY    . OPEN REDUCTION INTERNAL FIXATION (ORIF) DISTAL RADIAL FRACTURE Left 03/27/2016   Procedure: OPEN REDUCTION INTERNAL FIXATION (ORIF) DISTAL RADIAL FRACTURE;  Surgeon: Iran Planas, MD;  Location: [facility];  Service: Orthopedics;  Laterality: Left;  . ROBOTIC ASSISTED TOTAL HYSTERECTOMY  01/02/2012   Procedure: ROBOTIC ASSISTED TOTAL HYSTERECTOMY;  Surgeon: Peri Maris, MD;  Location: [facility];  Service: Gynecology;  Laterality: N/A;  Roboitc Bilateral Salpingectomy   . TONSILLECTOMY    . uterine ablation     Social History   Socioeconomic History  . Marital status: Married    Spouse name: Not on file  . Number of children: Not on file  . Years of education: Not on file  . Highest education level: Not on file  Social Needs  . Financial resource strain: Not on file  . Food insecurity - worry: Not on file  . Food insecurity - inability: Not on file  .  Transportation needs - medical: Not on file  . Transportation needs - non-medical: Not on file  Occupational History  . Not on file  Tobacco Use  . Smoking status: Former Smoker    Last attempt to quit: 02/01/1996    Years since quitting: 21.0  . Smokeless tobacco: Never Used  Substance and Sexual Activity  . Alcohol use: Yes    Alcohol/week: 0.5 oz    Types: 1 Standard drinks or equivalent per week    Comment: occasionally  . Drug use: No  . Sexual activity: Yes    Partners: Female    Birth control/protection: None  Other Topics Concern  . Not on file  Social History Narrative  . Not on file   No Known Allergies Family History  Problem Relation Age of Onset  . Thyroid disease Mother   . Breast cancer Mother 93       breast cancer     Past medical history, social, surgical and family history all reviewed in electronic medical record.  No pertanent information unless stated regarding to the chief complaint.   Review of Systems:Review of systems updated and as accurate as of 03/01/17  No headache, visual changes, nausea, vomiting, diarrhea, constipation, dizziness, abdominal pain, skin rash, fevers, chills, night sweats, weight loss, swollen lymph nodes, body aches, joint swelling, muscle  aches, chest pain, shortness of breath, mood changes.   Objective  Last menstrual period 12/28/2011. Systems examined below as of 03/01/17   General: No apparent distress alert and oriented x3 mood and affect normal, dressed appropriately.  HEENT: Pupils equal, extraocular movements intact  Respiratory: Patient's speak in full sentences and does not appear short of breath  Cardiovascular: No lower extremity edema, non tender, no erythema  Skin: Warm dry intact with no signs of infection or rash on extremities or on axial skeleton.  Abdomen: Soft nontender  Neuro: Cranial nerves II through XII are intact, neurovascularly intact in all extremities with 2+ DTRs and 2+ pulses.  Lymph: No  lymphadenopathy of posterior or anterior cervical chain or axillae bilaterally.  Gait normal with good balance and coordination.  MSK:  Non tender with full range of motion and good stability and symmetric strength and tone of shoulders, wrist, hip, knee and ankles bilaterally.  Elbow: Right Unremarkable to inspection. Range of motion full pronation, supination, flexion, extension. Strength is full to all of the above directions Stable to varus, valgus stress. Negative moving valgus stress test. Tender over the lateral epicondylar region Ulnar nerve does not sublux. Negative cubital tunnel Tinel's. Contralateral elbow unremarkable  Musculoskeletal ultrasound was performed and interpreted by Charlann Boxer D.O.   Elbow: Right Lateral epicondyle and common extensor tendon origin visualized.  Very small intrasubstance tearing noted at the origin.  Increasing Doppler flow.  No true retraction Radial head unremarkable and located in annular ligament Medial epicondyle and common flexor tendon origin visualized.  No edema, effusions, or avulsions seen. Ulnar nerve in cubital tunnel unremarkable. Olecranon and triceps insertion visualized and unremarkable without edema, effusion, or avulsion.  No signs olecranon bursitis. Power doppler signal normal.  IMPRESSION: Lateral epicondylitis   Impression and Recommendations:     This case required medical decision making of moderate complexity.      Note: This dictation was prepared with Dragon dictation along with smaller phrase technology. Any transcriptional errors that result from this process are unintentional.

## 2017-03-02 ENCOUNTER — Ambulatory Visit: Payer: Self-pay

## 2017-03-02 ENCOUNTER — Ambulatory Visit (INDEPENDENT_AMBULATORY_CARE_PROVIDER_SITE_OTHER): Payer: Managed Care, Other (non HMO) | Admitting: Family Medicine

## 2017-03-02 ENCOUNTER — Encounter: Payer: Self-pay | Admitting: Family Medicine

## 2017-03-02 VITALS — BP 110/72 | HR 64 | Wt 134.0 lb

## 2017-03-02 DIAGNOSIS — M7711 Lateral epicondylitis, right elbow: Secondary | ICD-10-CM

## 2017-03-02 DIAGNOSIS — M25521 Pain in right elbow: Secondary | ICD-10-CM

## 2017-03-02 MED ORDER — NITROGLYCERIN 0.2 MG/HR TD PT24: MEDICATED_PATCH | TRANSDERMAL | 1 refills | Status: DC

## 2017-03-02 NOTE — Patient Instructions (Signed)
Good to see you  Ice 20 minutes 2 times daily. Usually after activity and before bed. No wrist extension Wrist brace day and night for 1 week then nightly for 2 weeks.  pennsaid pinkie amount topically 2 times daily as needed.  Exercises on wall.  Heel and butt touching.  Raise leg 6 inches and hold 2 seconds.  Down slow for count of 4 seconds.  1 set of 30 reps daily on both sides.   Nitroglycerin Protocol   Apply 1/4 nitroglycerin patch to affected area daily.  Change position of patch within the affected area every 24 hours.  You may experience a headache during the first 1-2 weeks of using the patch, these should subside.  If you experience headaches after beginning nitroglycerin patch treatment, you may take your preferred over the counter pain reliever.  Another side effect of the nitroglycerin patch is skin irritation or rash related to patch adhesive.  Please notify our office if you develop more severe headaches or rash, and stop the patch.  Tendon healing with nitroglycerin patch may require 12 to 24 weeks depending on the extent of injury.  Men should not use if taking Viagra, Cialis, or Levitra.   Do not use if you have migraines or rosacea.   See me again in 3-4 weeks

## 2017-03-02 NOTE — Assessment & Plan Note (Signed)
Lateral Epicondylitis: Elbow anatomy was reviewed, and tendinopathy was explained.  Pt. given a formal rehab program. Series of concentric and eccentric exercises should be done starting with no weight, work up to 1 lb, hammer, etc.  Use counterforce strap if working or using hands.  Formal PT would be beneficial. Emphasized stretching an cross-friction massage Emphasized proper palms up lifting biomechanics to unload ECRB started also nitroglycerin and warned of potential side effects. RTC in 4 weeks.

## 2017-03-03 ENCOUNTER — Other Ambulatory Visit: Payer: Self-pay

## 2017-03-03 ENCOUNTER — Ambulatory Visit (INDEPENDENT_AMBULATORY_CARE_PROVIDER_SITE_OTHER): Payer: Managed Care, Other (non HMO) | Admitting: Family Medicine

## 2017-03-03 ENCOUNTER — Ambulatory Visit: Payer: Self-pay

## 2017-03-03 ENCOUNTER — Ambulatory Visit (INDEPENDENT_AMBULATORY_CARE_PROVIDER_SITE_OTHER)
Admission: RE | Admit: 2017-03-03 | Discharge: 2017-03-03 | Disposition: A | Discharge status: Home / Self Care | Payer: Managed Care, Other (non HMO) | Source: Ambulatory Visit | Attending: Family Medicine | Admitting: Family Medicine

## 2017-03-03 VITALS — BP 110/72 | HR 64 | Wt 134.0 lb

## 2017-03-03 DIAGNOSIS — M79672 Pain in left foot: Secondary | ICD-10-CM

## 2017-03-03 DIAGNOSIS — M71372 Other bursal cyst, left ankle and foot: Secondary | ICD-10-CM | POA: Diagnosis not present

## 2017-03-03 NOTE — Progress Notes (Signed)
[facility] [place],  [address] Phone: [phone] Subjective:       CC: Left foot pain  [id number]  Shelly [name] is a 47 y.o. female coming in with complaint of left foot pain. Patient was running on treadmill last night on an incline. After workout patient ascended 4 flights of stairs in route to a meeting. Once seated, she began to have pain in left foot at base of 5th metatarsal.   Onset- acute, yesterday Location- left foot, base of 5th Duration- constant  Character-sharp Aggravating factors- walking Therapies tried-but is now improving slowly. Severity-when it occurs 7/10     Past Medical History:  Diagnosis Date  . 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 No pertinent past medical history    Past Surgical History:  Procedure Laterality Date  . CYSTOSCOPY  01/02/2012   Procedure: CYSTOSCOPY;  Surgeon: Peri Maris, MD;  Location: [facility];  Service: Gynecology;  Laterality: N/A;  . DILATION AND CURETTAGE OF UTERUS    . HYSTEROSCOPY    . OPEN REDUCTION INTERNAL FIXATION (ORIF) DISTAL RADIAL FRACTURE Left 03/27/2016   Procedure: OPEN REDUCTION INTERNAL FIXATION (ORIF) DISTAL RADIAL FRACTURE;  Surgeon: Iran Planas, MD;  Location: [facility];  Service: Orthopedics;  Laterality: Left;  . ROBOTIC ASSISTED TOTAL HYSTERECTOMY  01/02/2012   Procedure: ROBOTIC ASSISTED TOTAL HYSTERECTOMY;  Surgeon: Peri Maris, MD;  Location: [facility];  Service: Gynecology;  Laterality: N/A;  Roboitc Bilateral Salpingectomy   . TONSILLECTOMY    . uterine ablation     Social History   Socioeconomic History  . Marital status: Married    Spouse name: Not on file  . Number of children: Not on file  . Years of education: Not on file  . Highest education level: Not on file  Social Needs  . Financial resource strain: Not on file  . Food insecurity - worry: Not on file  . Food insecurity - inability: Not on file  . Transportation  needs - medical: Not on file  . Transportation needs - non-medical: Not on file  Occupational History  . Not on file  Tobacco Use  . Smoking status: Former Smoker    Last attempt to quit: 02/01/1996    Years since quitting: 21.0  . Smokeless tobacco: Never Used  Substance and Sexual Activity  . Alcohol use: Yes    Alcohol/week: 0.5 oz    Types: 1 Standard drinks or equivalent per week    Comment: occasionally  . Drug use: No  . Sexual activity: Yes    Partners: Female    Birth control/protection: None  Other Topics Concern  . Not on file  Social History Narrative  . Not on file   No Known Allergies Family History  Problem Relation Age of Onset  . Thyroid disease Mother   . Breast cancer Mother 5       breast cancer     Past medical history, social, surgical and family history all reviewed in electronic medical record.  No pertanent information unless stated regarding to the chief complaint.   Review of Systems:Review of systems updated and as accurate as of 03/03/17  No headache, visual changes, nausea, vomiting, diarrhea, constipation, dizziness, abdominal pain, skin rash, fevers, chills, night sweats, weight loss, swollen lymph nodes, body aches, joint swelling, muscle aches, chest pain, shortness of breath, mood changes.   Objective  Last menstrual period 12/28/2011. Blood pressure 110/72, pulse 64, weight  134 lb (60.8 kg), last menstrual period 12/28/2011.   Systems examined below as of 03/03/17   General: No apparent distress alert and oriented x3 mood and affect normal, dressed appropriately.  HEENT: Pupils equal, extraocular movements intact  Respiratory: Patient's speak in full sentences and does not appear short of breath  Cardiovascular: No lower extremity edema, non tender, no erythema  Skin: Warm dry intact with no signs of infection or rash on extremities or on axial skeleton.  Abdomen: Soft nontender  Neuro: Cranial nerves II through XII are intact,  neurovascularly intact in all extremities with 2+ DTRs and 2+ pulses.  Lymph: No lymphadenopathy of posterior or anterior cervical chain or axillae bilaterally.  Gait normal with good balance and coordination.  MSK:  Non tender with full range of motion and good stability and symmetric strength and tone of shoulders,  wrist, hip, knee and ankles bilaterally.  Foot exam shows the patient has some tenderness to palpation on the plantar aspect at the fourth and fifth.  Mild positive squeeze test.  No pain on the dorsal aspect of the foot.  Mild hallux limitus noted.  Narrow foot but otherwise well aligned.  Limited musculoskeletal ultrasound was performed and interpreted by Lyndal Pulley  Limited ultrasound shows the patient does have a cyst noted on the plantar aspect.  Seems to be small, likely inclusion body. Impression: Plantar cyst between the fourth and fifth metatarsals of the foot.    Impression and Recommendations:     This case required medical decision making of moderate complexity.      Note: This dictation was prepared with Dragon dictation along with smaller phrase technology. Any transcriptional errors that result from this process are unintentional.

## 2017-03-03 NOTE — Patient Instructions (Signed)
Good to see you  Just a cyst  Can truck through Do not lace the last eye near the toes.  Keep up with everything else See me when you want me to poke it

## 2017-03-04 DIAGNOSIS — M71372 Other bursal cyst, left ankle and foot: Secondary | ICD-10-CM | POA: Insufficient documentation

## 2017-03-04 NOTE — Assessment & Plan Note (Signed)
Cyst noted.  Not very big.  Encourage patient to float foot with a certain orthotic.  Worsening symptoms we will consider aspiration.

## 2017-03-25 NOTE — Progress Notes (Signed)
[name], [place] [address] Phone: [phone] Subjective:    I'm seeing this patient by the request  of:    CC: Right elbow pain follow-up  [id number]  Shelly [name] is a 47 y.o. female coming in with complaint of right elbow pain.  Found to have a lateral epicondyle.  Patient was started on nitroglycerin secondary to the tearing.  Given home exercises and bracing.  Patient states minimal improvement at this time.  No side effects of the nitroglycerin.  Patient states that it is still more of a soreness.  Patient was also seen recently and did have a cyst on the plantar aspect of the foot.  Patient states doing well.  Seems stable.     Past Medical History:  Diagnosis Date  . 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 No pertinent past medical history    Past Surgical History:  Procedure Laterality Date  . CYSTOSCOPY  01/02/2012   Procedure: CYSTOSCOPY;  Surgeon: Peri Maris, MD;  Location: [facility];  Service: Gynecology;  Laterality: N/A;  . DILATION AND CURETTAGE OF UTERUS    . HYSTEROSCOPY    . OPEN REDUCTION INTERNAL FIXATION (ORIF) DISTAL RADIAL FRACTURE Left 03/27/2016   Procedure: OPEN REDUCTION INTERNAL FIXATION (ORIF) DISTAL RADIAL FRACTURE;  Surgeon: Iran Planas, MD;  Location: [facility];  Service: Orthopedics;  Laterality: Left;  . ROBOTIC ASSISTED TOTAL HYSTERECTOMY  01/02/2012   Procedure: ROBOTIC ASSISTED TOTAL HYSTERECTOMY;  Surgeon: Peri Maris, MD;  Location: [facility];  Service: Gynecology;  Laterality: N/A;  Roboitc Bilateral Salpingectomy   . TONSILLECTOMY    . uterine ablation     Social History   Socioeconomic History  . Marital status: Married    Spouse name: None  . Number of children: None  . Years of education: None  . Highest education level: None  Social Needs  . Financial resource strain: None  . Food insecurity - worry: None  . Food insecurity - inability: None  . Transportation  needs - medical: None  . Transportation needs - non-medical: None  Occupational History  . None  Tobacco Use  . Smoking status: Former Smoker    Last attempt to quit: 02/01/1996    Years since quitting: 21.1  . Smokeless tobacco: Never Used  Substance and Sexual Activity  . Alcohol use: Yes    Alcohol/week: 0.5 oz    Types: 1 Standard drinks or equivalent per week    Comment: occasionally  . Drug use: No  . Sexual activity: Yes    Partners: Female    Birth control/protection: None  Other Topics Concern  . None  Social History Narrative  . None   No Known Allergies Family History  Problem Relation Age of Onset  . Thyroid disease Mother   . Breast cancer Mother 70       breast cancer     Past medical history, social, surgical and family history all reviewed in electronic medical record.  No pertanent information unless stated regarding to the chief complaint.   Review of Systems:Review of systems updated and as accurate as of 03/27/17  No headache, visual changes, nausea, vomiting, diarrhea, constipation, dizziness, abdominal pain, skin rash, fevers, chills, night sweats, weight loss, swollen lymph nodes, body aches, joint swelling, muscle aches, chest pain, shortness of breath, mood changes.   Objective  Blood pressure 110/70, pulse (!) 58, height 5\' 6"  (1.676 m), weight 139 lb (63  kg), last menstrual period 12/28/2011, SpO2 98 %. Systems examined below as of 03/27/17   General: No apparent distress alert and oriented x3 mood and affect normal, dressed appropriately.  HEENT: Pupils equal, extraocular movements intact  Respiratory: Patient's speak in full sentences and does not appear short of breath  Cardiovascular: No lower extremity edema, non tender, no erythema  Skin: Warm dry intact with no signs of infection or rash on extremities or on axial skeleton.  Abdomen: Soft nontender  Neuro: Cranial nerves [name] through XII are intact, neurovascularly intact in all extremities  with 2+ DTRs and 2+ pulses.  Lymph: No lymphadenopathy of posterior or anterior cervical chain or axillae bilaterally.  Gait normal with good balance and coordination.  MSK:  Non tender with full range of motion and good stability and symmetric strength and tone of shoulders,  wrist, hip, knee and ankles bilaterally.  Elbow: Right Unremarkable to inspection. Range of motion full pronation, supination, flexion, extension. Strength is full to all of the above directions Stable to varus, valgus stress. Negative moving valgus stress test. Still tender to palpation over the lateral epicondylar region. Ulnar nerve does not sublux. Negative cubital tunnel Tinel's.  Musculoskeletal ultrasound was performed and interpreted by Charlann Boxer D.O.   Elbow:  Lateral epicondyle and common extensor tendon origin visualized.  Patient does still have some inner substance tearing.  Patient though findings of the avulsion seems to be healing at this time.  Mild new intrasubstance tearing noted.  IMPRESSION: Possible small worsening of the intrasubstance tearing of common extensor tendon     Impression and Recommendations:     This case required medical decision making of moderate complexity.      Note: This dictation was prepared with Dragon dictation along with smaller phrase technology. Any transcriptional errors that result from this process are unintentional.

## 2017-03-27 ENCOUNTER — Encounter: Payer: Self-pay | Admitting: Family Medicine

## 2017-03-27 ENCOUNTER — Ambulatory Visit (INDEPENDENT_AMBULATORY_CARE_PROVIDER_SITE_OTHER): Payer: Managed Care, Other (non HMO) | Admitting: Family Medicine

## 2017-03-27 ENCOUNTER — Ambulatory Visit: Payer: Self-pay

## 2017-03-27 VITALS — BP 110/70 | HR 58 | Ht 66.0 in | Wt 139.0 lb

## 2017-03-27 DIAGNOSIS — M7711 Lateral epicondylitis, right elbow: Secondary | ICD-10-CM

## 2017-03-27 DIAGNOSIS — M25521 Pain in right elbow: Secondary | ICD-10-CM

## 2017-03-27 MED ORDER — VITAMIN D (ERGOCALCIFEROL) 1.25 MG (50000 UNIT) PO CAPS: 50000.0000 [IU] | ORAL_CAPSULE | ORAL | 0 refills | Status: DC

## 2017-03-27 NOTE — Patient Instructions (Signed)
Good to see you  Alvera Singh is your friend.  Wear brace at night Continue the nitro  Avoid any overhand activity  Lets slow it down for 2-3 weeks Give it time brng handle bars up  Restart the vitamin D See me again in 4 weeks

## 2017-03-27 NOTE — Assessment & Plan Note (Signed)
Intrasubstance tearing of still noted.  Continue the vitamin D, start on nitroglycerin again.  We discussed that worsening symptoms of injection possibly needed.  Patient will.  Follow-up again in 4-6 weeks

## 2017-04-18 NOTE — Progress Notes (Signed)
[name] [address] [place], [place] [address] Phone: [phone] Subjective:    I'm seeing this patient by the request  of:    CC: Right elbow pain  [id number]  Shelly [name] is a 47 y.o. female coming in with complaint of right elbow pain.  Patient did have more of a lateral epicondylitis.  Started on nitroglycerin.  States that she is feeling 85-90% better.  Feels like it is making progress.  Denies any numbness.  Still some soreness.  Nothing that is as severe.       Past Medical History:  Diagnosis Date  . 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 No pertinent past medical history    Past Surgical History:  Procedure Laterality Date  . CYSTOSCOPY  01/02/2012   Procedure: CYSTOSCOPY;  Surgeon: Peri Maris, MD;  Location: [facility];  Service: Gynecology;  Laterality: N/A;  . DILATION AND CURETTAGE OF UTERUS    . HYSTEROSCOPY    . OPEN REDUCTION INTERNAL FIXATION (ORIF) DISTAL RADIAL FRACTURE Left 03/27/2016   Procedure: OPEN REDUCTION INTERNAL FIXATION (ORIF) DISTAL RADIAL FRACTURE;  Surgeon: Iran Planas, MD;  Location: [facility];  Service: Orthopedics;  Laterality: Left;  . ROBOTIC ASSISTED TOTAL HYSTERECTOMY  01/02/2012   Procedure: ROBOTIC ASSISTED TOTAL HYSTERECTOMY;  Surgeon: Peri Maris, MD;  Location: [facility];  Service: Gynecology;  Laterality: N/A;  Roboitc Bilateral Salpingectomy   . TONSILLECTOMY    . uterine ablation     Social History   Socioeconomic History  . Marital status: Married    Spouse name: None  . Number of children: None  . Years of education: None  . Highest education level: None  Social Needs  . Financial resource strain: None  . Food insecurity - worry: None  . Food insecurity - inability: None  . Transportation needs - medical: None  . Transportation needs - non-medical: None  Occupational History  . None  Tobacco Use  . Smoking status: Former Smoker    Last attempt to quit: 02/01/1996   Years since quitting: 21.2  . Smokeless tobacco: Never Used  Substance and Sexual Activity  . Alcohol use: Yes    Alcohol/week: 0.5 oz    Types: 1 Standard drinks or equivalent per week    Comment: occasionally  . Drug use: No  . Sexual activity: Yes    Partners: Female    Birth control/protection: None  Other Topics Concern  . None  Social History Narrative  . None   No Known Allergies Family History  Problem Relation Age of Onset  . Thyroid disease Mother   . Breast cancer Mother 29       breast cancer     Past medical history, social, surgical and family history all reviewed in electronic medical record.  No pertanent information unless stated regarding to the chief complaint.   Review of Systems:Review of systems updated and as accurate as of 04/19/17  No headache, visual changes, nausea, vomiting, diarrhea, constipation, dizziness, abdominal pain, skin rash, fevers, chills, night sweats, weight loss, swollen lymph nodes, body aches, joint swelling, , chest pain, shortness of breath, mood changes.  Mild positive muscle aches  Objective  Blood pressure 102/72, pulse (!) 55, height 5\' 6"  (1.676 m), weight 139 lb (63 kg), last menstrual period 12/28/2011, SpO2 96 %. Systems examined below as of 04/19/17   General: No apparent distress alert and oriented x3 mood and affect normal, dressed appropriately.  HEENT: Pupils equal, extraocular movements intact  Respiratory: Patient's speak in full sentences and does not appear short of breath  Cardiovascular: No lower extremity edema, non tender, no erythema  Skin: Warm dry intact with no signs of infection or rash on extremities or on axial skeleton.  Abdomen: Soft nontender  Neuro: Cranial nerves II through XII are intact, neurovascularly intact in all extremities with 2+ DTRs and 2+ pulses.  Lymph: No lymphadenopathy of posterior or anterior cervical chain or axillae bilaterally.  Gait normal with good balance and coordination.    MSK:  Non tender with full range of motion and good stability and symmetric strength and tone of shoulders, wrist, hip, knee and ankles bilaterally.  Elbow: Right Unremarkable to inspection. Range of motion full pronation, supination, flexion, extension. Strength is full to all of the above directions Stable to varus, valgus stress. Negative moving valgus stress test. Very mild pain over the lateral epicondylar region. Ulnar nerve does not sublux. Negative cubital tunnel Tinel's. Contralateral elbow unremarkable  Musculoskeletal ultrasound was performed and interpreted by Charlann Boxer D.O.   Elbow:  Lateral epicondylar region shows that intrasubstance tearing has completely resolved at this time.  Mild increase in Doppler flow noted.  Overall seems to be significantly improved.  IMPRESSION: Significant improvement.  From previous exam of the lateral epicondylitis    Impression and Recommendations:     This case required medical decision making of moderate complexity.      Note: This dictation was prepared with Dragon dictation along with smaller phrase technology. Any transcriptional errors that result from this process are unintentional.

## 2017-04-19 ENCOUNTER — Ambulatory Visit: Payer: Self-pay

## 2017-04-19 ENCOUNTER — Ambulatory Visit (INDEPENDENT_AMBULATORY_CARE_PROVIDER_SITE_OTHER): Payer: Managed Care, Other (non HMO) | Admitting: Family Medicine

## 2017-04-19 ENCOUNTER — Encounter: Payer: Self-pay | Admitting: Family Medicine

## 2017-04-19 VITALS — BP 102/72 | HR 55 | Ht 66.0 in | Wt 139.0 lb

## 2017-04-19 DIAGNOSIS — M7711 Lateral epicondylitis, right elbow: Secondary | ICD-10-CM | POA: Diagnosis not present

## 2017-04-19 DIAGNOSIS — M25521 Pain in right elbow: Secondary | ICD-10-CM

## 2017-04-19 NOTE — Assessment & Plan Note (Signed)
Significant improvement at this time.  Continue the nitroglycerin and the once weekly vitamin D.  Discussed continuing to monitor ergonomics and proper lifting positioning.  Follow-up again in 6 weeks

## 2017-04-19 NOTE — Patient Instructions (Signed)
Good to see you  Ice is still your friend.  Keep it up  Still lift palm up  After the cruise ok to use the irons at a driving range  You should be good for a round in April  Counterforce brace with activity only  Continue the patch  See me again in 6 weeks if not perfect

## 2017-06-01 ENCOUNTER — Ambulatory Visit (INDEPENDENT_AMBULATORY_CARE_PROVIDER_SITE_OTHER): Payer: Managed Care, Other (non HMO) | Admitting: Family Medicine

## 2017-06-01 ENCOUNTER — Encounter: Payer: Self-pay | Admitting: Family Medicine

## 2017-06-01 DIAGNOSIS — M7711 Lateral epicondylitis, right elbow: Secondary | ICD-10-CM

## 2017-06-01 NOTE — Assessment & Plan Note (Signed)
Doing well at this time.  Discussed icing regimen and home exercise.  Discussed which activities to do which wants to avoid.  Increase activity as tolerated.  Patient is otherwise doing well will continue with conservative therapy.  Over the next 6 weeks we will titrate off the nitroglycerin.  Worsening pain will come back and we discussed the possibility of PRP.  Follow-up as needed

## 2017-06-01 NOTE — Progress Notes (Signed)
[facility] [address], [place] [address] Phone: [phone] Subjective:       CC: elbow pain follow-up  [id number]  Shelly [name] is a 47 y.o. female coming in with complaint of elbow pain. Lateral epicondylitis.  On nitro, doing 90% better.  Has been icing, playing golf. No new symptoms.       Past Medical History:  Diagnosis Date  . 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 No pertinent past medical history    Past Surgical History:  Procedure Laterality Date  . CYSTOSCOPY  01/02/2012   Procedure: CYSTOSCOPY;  Surgeon: Peri Maris, MD;  Location: [facility];  Service: Gynecology;  Laterality: N/A;  . DILATION AND CURETTAGE OF UTERUS    . HYSTEROSCOPY    . OPEN REDUCTION INTERNAL FIXATION (ORIF) DISTAL RADIAL FRACTURE Left 03/27/2016   Procedure: OPEN REDUCTION INTERNAL FIXATION (ORIF) DISTAL RADIAL FRACTURE;  Surgeon: Iran Planas, MD;  Location: [facility];  Service: Orthopedics;  Laterality: Left;  . ROBOTIC ASSISTED TOTAL HYSTERECTOMY  01/02/2012   Procedure: ROBOTIC ASSISTED TOTAL HYSTERECTOMY;  Surgeon: Peri Maris, MD;  Location: [facility];  Service: Gynecology;  Laterality: N/A;  Roboitc Bilateral Salpingectomy   . TONSILLECTOMY    . uterine ablation     Social History   Socioeconomic History  . Marital status: Married    Spouse name: Not on file  . Number of children: Not on file  . Years of education: Not on file  . Highest education level: Not on file  Occupational History  . Not on file  Social Needs  . Financial resource strain: Not on file  . Food insecurity:    Worry: Not on file    Inability: Not on file  . Transportation needs:    Medical: Not on file    Non-medical: Not on file  Tobacco Use  . Smoking status: Former Smoker    Last attempt to quit: 02/01/1996    Years since quitting: 21.3  . Smokeless tobacco: Never Used  Substance and Sexual Activity  . Alcohol use: Yes    Alcohol/week: 0.5 oz      Types: 1 Standard drinks or equivalent per week    Comment: occasionally  . Drug use: No  . Sexual activity: Yes    Partners: Female    Birth control/protection: None  Lifestyle  . Physical activity:    Days per week: Not on file    Minutes per session: Not on file  . Stress: Not on file  Relationships  . Social connections:    Talks on phone: Not on file    Gets together: Not on file    Attends religious service: Not on file    Active member of club or organization: Not on file    Attends meetings of clubs or organizations: Not on file    Relationship status: Not on file  Other Topics Concern  . Not on file  Social History Narrative  . Not on file   No Known Allergies Family History  Problem Relation Age of Onset  . Thyroid disease Mother   . Breast cancer Mother 70       breast cancer     Past medical history, social, surgical and family history all reviewed in electronic medical record.  No pertanent information unless stated regarding to the chief complaint.   Review of Systems:Review of systems updated and as accurate as of 06/01/17  No headache,  visual changes, nausea, vomiting, diarrhea, constipation, dizziness, abdominal pain, skin rash, fevers, chills, night sweats, weight loss, swollen lymph nodes, body aches, joint swelling, muscle aches, chest pain, shortness of breath, mood changes.   Objective  Blood pressure 118/64, pulse (!) 50, height 5\' 6"  (1.676 m), weight 140 lb (63.5 kg), last menstrual period 12/28/2011, SpO2 97 %. Systems examined below as of 06/01/17   General: No apparent distress alert and oriented x3 mood and affect normal, dressed appropriately.  HEENT: Pupils equal, extraocular movements intact  Respiratory: Patient's speak in full sentences and does not appear short of breath  Cardiovascular: No lower extremity edema, non tender, no erythema  Skin: Warm dry intact with no signs of infection or rash on extremities or on axial skeleton.   Abdomen: Soft nontender  Neuro: Cranial nerves II through XII are intact, neurovascularly intact in all extremities with 2+ DTRs and 2+ pulses.  Lymph: No lymphadenopathy of posterior or anterior cervical chain or axillae bilaterally.  Gait normal with good balance and coordination.  MSK:  Non tender with full range of motion and good stability and symmetric strength and tone of shoulders,  wrist, hip, knee and ankles bilaterally.   Elbow: Right Unremarkable to inspection. Range of motion full pronation, supination, flexion, extension. Strength is full to all of the above directions Stable to varus, valgus stress. Negative moving valgus stress test. Still mildly tender over the lateral epicondylar region Ulnar nerve does not sublux. Negative cubital tunnel Tinel's. Contralateral elbow unremarkable     Impression and Recommendations:     This case required medical decision making of moderate complexity.      Note: This dictation was prepared with Dragon dictation along with smaller phrase technology. Any transcriptional errors that result from this process are unintentional.

## 2017-06-06 ENCOUNTER — Other Ambulatory Visit: Payer: Self-pay | Admitting: Family Medicine

## 2017-06-06 NOTE — Telephone Encounter (Signed)
Refill denied. Pt has completed course of treatment.  

## 2017-07-01 ENCOUNTER — Other Ambulatory Visit: Payer: Self-pay | Admitting: Family Medicine

## 2017-07-03 NOTE — Telephone Encounter (Signed)
Refill denied. Pt has completed course of treatment.  

## 2019-03-22 ENCOUNTER — Ambulatory Visit: Payer: Self-pay | Attending: Internal Medicine

## 2019-03-22 DIAGNOSIS — Z23 Encounter for immunization: Secondary | ICD-10-CM | POA: Insufficient documentation

## 2019-03-22 NOTE — Progress Notes (Signed)
   Covid-19 Vaccination Clinic  Name:  Shelly [name]    MRN: [id number] DOB: [birth year]  03/22/2019  [name] was observed post Covid-19 immunization for 15 minutes without incidence. She was provided with Vaccine Information Sheet and instruction to access the V-Safe system.   [name] was instructed to call 911 with any severe reactions post vaccine: Marland Kitchen Difficulty breathing  . Swelling of your face and throat  . A fast heartbeat  . A bad rash all over your body  . Dizziness and weakness    Immunizations Administered    Name Date Dose VIS Date Route   Pfizer COVID-19 Vaccine 03/22/2019  4:05 PM 0.3 mL 01/11/2019 Intramuscular   Manufacturer: [place]   Lot: [id number]   [place]: [id number]

## 2019-04-16 ENCOUNTER — Ambulatory Visit: Payer: Self-pay | Attending: Internal Medicine

## 2019-04-16 DIAGNOSIS — Z23 Encounter for immunization: Secondary | ICD-10-CM

## 2019-04-16 NOTE — Progress Notes (Signed)
   Covid-19 Vaccination Clinic  Name:  Shelly [name]    MRN: [id number] DOB: 1970/02/28  04/16/2019  [name] was observed post Covid-19 immunization for 15 minutes without incident. She was provided with Vaccine Information Sheet and instruction to access the V-Safe system.   [name] was instructed to call 911 with any severe reactions post vaccine: Marland Kitchen Difficulty breathing  . Swelling of face and throat  . A fast heartbeat  . A bad rash all over body  . Dizziness and weakness   Immunizations Administered    Name Date Dose VIS Date Route   Pfizer COVID-19 Vaccine 04/16/2019  8:38 AM 0.3 mL 01/11/2019 Intramuscular   Manufacturer: [place]   Lot: [id number]   [place]: [id number]

## 2019-11-29 ENCOUNTER — Other Ambulatory Visit: Payer: Self-pay | Admitting: Obstetrics

## 2019-11-29 DIAGNOSIS — R928 Other abnormal and inconclusive findings on diagnostic imaging of breast: Secondary | ICD-10-CM

## 2019-12-23 ENCOUNTER — Other Ambulatory Visit: Payer: Self-pay

## 2019-12-23 ENCOUNTER — Other Ambulatory Visit: Payer: Self-pay | Admitting: Obstetrics

## 2019-12-23 ENCOUNTER — Ambulatory Visit
Admission: RE | Admit: 2019-12-23 | Discharge: 2019-12-23 | Disposition: A | Discharge status: Home / Self Care | Payer: BC Managed Care – PPO | Source: Ambulatory Visit | Attending: Obstetrics | Admitting: Obstetrics

## 2019-12-23 DIAGNOSIS — R928 Other abnormal and inconclusive findings on diagnostic imaging of breast: Secondary | ICD-10-CM

## 2019-12-31 ENCOUNTER — Ambulatory Visit
Admission: RE | Admit: 2019-12-31 | Discharge: 2019-12-31 | Disposition: A | Discharge status: Home / Self Care | Payer: BC Managed Care – PPO | Source: Ambulatory Visit | Attending: Obstetrics | Admitting: Obstetrics

## 2019-12-31 ENCOUNTER — Other Ambulatory Visit: Payer: Self-pay

## 2019-12-31 DIAGNOSIS — R928 Other abnormal and inconclusive findings on diagnostic imaging of breast: Secondary | ICD-10-CM

## 2019-12-31 HISTORY — PX: BREAST BIOPSY: SHX20

## 2020-01-01 DIAGNOSIS — C801 Malignant (primary) neoplasm, unspecified: Secondary | ICD-10-CM

## 2020-01-01 HISTORY — DX: Malignant (primary) neoplasm, unspecified: C80.1

## 2020-01-03 ENCOUNTER — Encounter: Payer: Self-pay | Admitting: *Deleted

## 2020-01-03 ENCOUNTER — Telehealth: Payer: Self-pay | Admitting: *Deleted

## 2020-01-03 NOTE — Telephone Encounter (Signed)
Called pt, provided navigation resources and contact information. Scheduled and confirmed appt to see Dr. Lindi Adie on 12/10 at 1215 Encourage pt to call with questions or needs. Received verbal understanding.

## 2020-01-06 ENCOUNTER — Telehealth: Payer: Self-pay | Admitting: *Deleted

## 2020-01-06 NOTE — Telephone Encounter (Signed)
Left vm with negative Her2 results. Contact information provided for questions.

## 2020-01-10 ENCOUNTER — Encounter: Payer: Self-pay | Admitting: *Deleted

## 2020-01-10 ENCOUNTER — Inpatient Hospital Stay: Payer: BC Managed Care – PPO | Attending: Hematology and Oncology | Admitting: Hematology and Oncology

## 2020-01-10 ENCOUNTER — Other Ambulatory Visit: Payer: Self-pay | Admitting: *Deleted

## 2020-01-10 ENCOUNTER — Inpatient Hospital Stay: Payer: BC Managed Care – PPO

## 2020-01-10 ENCOUNTER — Other Ambulatory Visit: Payer: Self-pay

## 2020-01-10 DIAGNOSIS — C50412 Malignant neoplasm of upper-outer quadrant of left female breast: Secondary | ICD-10-CM

## 2020-01-10 DIAGNOSIS — Z23 Encounter for immunization: Secondary | ICD-10-CM | POA: Diagnosis not present

## 2020-01-10 DIAGNOSIS — Z90710 Acquired absence of both cervix and uterus: Secondary | ICD-10-CM

## 2020-01-10 DIAGNOSIS — Z87891 Personal history of nicotine dependence: Secondary | ICD-10-CM | POA: Diagnosis not present

## 2020-01-10 DIAGNOSIS — Z803 Family history of malignant neoplasm of breast: Secondary | ICD-10-CM

## 2020-01-10 DIAGNOSIS — Z170 Estrogen receptor positive status [ER+]: Secondary | ICD-10-CM

## 2020-01-10 NOTE — Assessment & Plan Note (Addendum)
Screening detected left breast mass, 1 o'clock position: 1.3 cm by ultrasound (about 2 cm by mammogram), 1 indeterminate left axillary lymph node. 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 After reviewing the pathology in detail, we proceeded to discuss the different treatment options between surgery, radiation, chemotherapy, antiestrogen therapies.  Recommendations: 1. Breast conserving surgery followed by 2. Oncotype DX testing to determine if chemotherapy would be of any benefit followed by 3. Adjuvant radiation therapy followed by 4. Adjuvant antiestrogen therapy  Oncotype counseling: I discussed Oncotype DX test. I explained to the patient that this is a 21 gene panel to evaluate patient tumors DNA to calculate recurrence score. This would help determine whether patient has high risk or intermediate risk or low risk breast cancer. She understands that if her tumor was found to be high risk, she would benefit from systemic chemotherapy. If low risk, no need of chemotherapy. If she was found to be intermediate risk, we would need to evaluate the score as well as other risk factors and determine if an abbreviated chemotherapy may be of benefit.  Return to clinic after surgery to discuss final pathology report and then determine if Oncotype DX testing will need to be sent. [employer]

## 2020-01-10 NOTE — Progress Notes (Signed)
   Covid-19 Vaccination Clinic  Name:  Shelly [name]    MRN: [id number] DOB: 10/29/1970  01/10/2020  [name] was observed post Covid-19 immunization for 15 minutes without incident. She was provided with Vaccine Information Sheet and instruction to access the V-Safe system.   [name] was instructed to call 911 with any severe reactions post vaccine: Marland Kitchen Difficulty breathing  . Swelling of face and throat  . A fast heartbeat  . A bad rash all over body  . Dizziness and weakness   Immunizations Administered    Name Date Dose VIS Date Route   Pfizer COVID-19 Vaccine 01/10/2020  1:06 PM 0.3 mL 11/20/2019 Intramuscular   Manufacturer: [place]   Lot: [id number]   [place]: [id number]

## 2020-01-10 NOTE — Progress Notes (Signed)
[facility] CONSULT NOTE  Patient Care Team: Jerelyn Charles, MD as PCP - General (Obstetrics) Mauro Kaufmann, RN as Oncology Nurse Navigator Rockwell Germany, RN as Oncology Nurse Navigator  CHIEF COMPLAINTS/PURPOSE OF CONSULTATION:  Newly diagnosed breast cancer  HISTORY OF PRESENTING ILLNESS:  Shelly [name] 49 y.o. female is here because of recent diagnosis of left breast cancer.  Patient had a routine screening mammogram that detected abnormality in the left breast which led to additional mammograms and ultrasounds and biopsies.  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  Lymph node biopsy was benign.  She was referred to [place] for discussion regarding adjuvant treatment options.  Her significant other is Dr. Christia Reading her gynecologist at [facility].  She accompanied her today.  I reviewed her records extensively and collaborated the history with the patient.  SUMMARY OF ONCOLOGIC HISTORY: Oncology History  Malignant neoplasm of upper-outer quadrant of left breast in female, estrogen receptor positive ([place])  12/31/2019 Initial Diagnosis   Screening detected left breast mass, 1 o'clock position: 1.3 cm by ultrasound (about 2 cm by mammogram), 1 indeterminate left axillary lymph node. 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 No pertinent past medical history     SURGICAL HISTORY: Past Surgical History:  Procedure Laterality Date  .  CYSTOSCOPY  01/02/2012   Procedure: CYSTOSCOPY;  Surgeon: Peri Maris, MD;  Location: [facility];  Service: Gynecology;  Laterality: N/A;  . DILATION AND CURETTAGE OF UTERUS    . HYSTEROSCOPY    . OPEN REDUCTION INTERNAL FIXATION (ORIF) DISTAL RADIAL FRACTURE Left 03/27/2016   Procedure: OPEN REDUCTION INTERNAL FIXATION (ORIF) DISTAL RADIAL FRACTURE;  Surgeon: Iran Planas, MD;  Location: [facility];  Service: Orthopedics;  Laterality: Left;  . ROBOTIC ASSISTED TOTAL HYSTERECTOMY  01/02/2012   Procedure: ROBOTIC ASSISTED TOTAL HYSTERECTOMY;  Surgeon: Peri Maris, MD;  Location: [facility];  Service: Gynecology;  Laterality: N/A;  Roboitc Bilateral Salpingectomy   . TONSILLECTOMY    . uterine ablation      SOCIAL HISTORY: Social History   Socioeconomic History  . Marital status: Married    Spouse name: Not on file  . Number of children: Not on file  . Years of education: Not on file  . Highest education level: Not on file  Occupational History  . Not on file  Tobacco Use  . Smoking status: Former Smoker    Quit date: 02/01/1996    Years since quitting: 23.9  . Smokeless tobacco: Never Used  Substance and Sexual Activity  . Alcohol use: Yes    Alcohol/week: 1.0 standard drink    Types: 1 Standard drinks or equivalent per week    Comment: occasionally  . Drug use: No  . Sexual activity: Yes    Partners: Female    Birth control/protection: None  Other Topics Concern  . Not on file  Social History Narrative  . Not on  file   Social Determinants of Health   Financial Resource Strain: Not on file  Food Insecurity: Not on file  Transportation Needs: Not on file  Physical Activity: Not on file  Stress: Not on file  Social Connections: Not on file  Intimate Partner Violence: Not on file    FAMILY HISTORY: Family History  Problem Relation Age of Onset  . Thyroid disease Mother   . Breast cancer Mother 88       breast cancer    ALLERGIES:  has No Known  Allergies.  MEDICATIONS:  Current Outpatient Medications  Medication Sig Dispense Refill  . ibuprofen (ADVIL,MOTRIN) 200 MG tablet Take 200 mg by mouth every 6 (six) hours as needed.    . loratadine (CLARITIN) 10 MG tablet Take 10 mg by mouth daily.    . nitroGLYCERIN (NITRODUR - DOSED IN MG/24 HR) 0.2 mg/hr patch 1/4 patch daily 30 patch 1  . Vitamin D, Ergocalciferol, (DRISDOL) 50000 units CAPS capsule Take 1 capsule (50,000 Units total) by mouth every 7 (seven) days. 12 capsule 0  . Vitamin D, Ergocalciferol, (DRISDOL) 50000 units CAPS capsule Take 1 capsule (50,000 Units total) by mouth every 7 (seven) days. 12 capsule 0   No current facility-administered medications for this visit.    REVIEW OF SYSTEMS:     All other systems were reviewed with the patient and are negative.  PHYSICAL EXAMINATION: ECOG PERFORMANCE STATUS: 0 - Asymptomatic  Vitals:   01/10/20 1227  BP: 105/60  Pulse: 65  Resp: 19  Temp: 97.8 F (36.6 C)  SpO2: 99%   Filed Weights   01/10/20 1227  Weight: 137 lb 12.5 oz (62.5 kg)     LABORATORY DATA:  I have reviewed the data as listed Lab Results  Component Value Date   WBC 5.7 12/28/2011   HGB 13.0 09/03/2012   HCT 41.0 05/21/2012   MCV 89.1 12/28/2011   PLT 203 12/28/2011   Lab Results  Component Value Date   NA 143 05/21/2012   K 3.8 05/21/2012   CL 102 05/21/2012   CO2 28 01/02/2012    RADIOGRAPHIC STUDIES: I have personally reviewed the radiological reports and agreed with the findings in the report.  ASSESSMENT AND PLAN:  Malignant neoplasm of upper-outer quadrant of left breast in female, estrogen receptor positive ([place]) Screening detected left breast mass, 1 o'clock position: 1.3 cm by ultrasound (about 2 cm by mammogram), 1 indeterminate left axillary lymph node. 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 After reviewing the pathology in detail, we proceeded to discuss the different treatment options between surgery, radiation, chemotherapy, antiestrogen therapies.  Recommendations: 1. Breast conserving surgery followed by 2. Oncotype DX testing to determine if chemotherapy would be of any benefit followed by 3. Adjuvant radiation therapy followed by 4. Adjuvant antiestrogen therapy  Oncotype counseling: I discussed Oncotype DX test. I explained to the patient that this is a 21 gene panel to evaluate patient tumors DNA to calculate recurrence score. This would help determine whether patient has high risk or intermediate risk or low risk breast cancer. She understands that if her tumor was found to be high risk, she would benefit from systemic  chemotherapy. If low risk, no need of chemotherapy. If she was found to be intermediate risk, we would need to evaluate the score as well as other risk factors and determine if an abbreviated chemotherapy may be of benefit.  Return to clinic after surgery to discuss final pathology report and then determine if Oncotype DX testing will need to be sent.    All questions were answered. The patient knows to call the clinic with any problems, questions or concerns.    Harriette Ohara, MD 01/10/20

## 2020-01-13 ENCOUNTER — Encounter: Payer: Self-pay | Admitting: *Deleted

## 2020-01-13 ENCOUNTER — Other Ambulatory Visit: Payer: Self-pay

## 2020-01-13 ENCOUNTER — Other Ambulatory Visit: Payer: Self-pay | Admitting: General Surgery

## 2020-01-13 ENCOUNTER — Encounter (HOSPITAL_BASED_OUTPATIENT_CLINIC_OR_DEPARTMENT_OTHER): Payer: Self-pay | Admitting: General Surgery

## 2020-01-13 DIAGNOSIS — C50412 Malignant neoplasm of upper-outer quadrant of left female breast: Secondary | ICD-10-CM

## 2020-01-14 ENCOUNTER — Telehealth: Payer: Self-pay | Admitting: Hematology and Oncology

## 2020-01-14 ENCOUNTER — Other Ambulatory Visit: Payer: Self-pay | Admitting: General Surgery

## 2020-01-14 ENCOUNTER — Other Ambulatory Visit (HOSPITAL_COMMUNITY)
Admission: RE | Admit: 2020-01-14 | Discharge: 2020-01-14 | Disposition: A | Discharge status: Home / Self Care | Payer: BC Managed Care – PPO | Source: Ambulatory Visit | Attending: General Surgery | Admitting: General Surgery

## 2020-01-14 DIAGNOSIS — C50412 Malignant neoplasm of upper-outer quadrant of left female breast: Secondary | ICD-10-CM | POA: Diagnosis not present

## 2020-01-14 DIAGNOSIS — Z170 Estrogen receptor positive status [ER+]: Secondary | ICD-10-CM

## 2020-01-14 DIAGNOSIS — Z01812 Encounter for preprocedural laboratory examination: Secondary | ICD-10-CM | POA: Insufficient documentation

## 2020-01-14 DIAGNOSIS — C50912 Malignant neoplasm of unspecified site of left female breast: Secondary | ICD-10-CM | POA: Diagnosis present

## 2020-01-14 DIAGNOSIS — Z20822 Contact with and (suspected) exposure to covid-19: Secondary | ICD-10-CM | POA: Insufficient documentation

## 2020-01-14 LAB — SARS CORONAVIRUS 2 (TAT 6-24 HRS): SARS Coronavirus 2: NEGATIVE

## 2020-01-14 NOTE — Telephone Encounter (Signed)
No 12/10 los, no changes made to pt schedule

## 2020-01-16 ENCOUNTER — Ambulatory Visit: Payer: BC Managed Care – PPO

## 2020-01-16 ENCOUNTER — Other Ambulatory Visit: Payer: Self-pay

## 2020-01-16 ENCOUNTER — Ambulatory Visit: Payer: BC Managed Care – PPO | Admitting: Radiation Oncology

## 2020-01-16 ENCOUNTER — Ambulatory Visit
Admission: RE | Admit: 2020-01-16 | Discharge: 2020-01-16 | Disposition: A | Discharge status: Home / Self Care | Payer: BC Managed Care – PPO | Source: Ambulatory Visit | Attending: General Surgery | Admitting: General Surgery

## 2020-01-16 DIAGNOSIS — C50412 Malignant neoplasm of upper-outer quadrant of left female breast: Secondary | ICD-10-CM

## 2020-01-17 ENCOUNTER — Ambulatory Visit (HOSPITAL_COMMUNITY)
Admission: RE | Admit: 2020-01-17 | Discharge: 2020-01-17 | Disposition: A | Discharge status: Home / Self Care | Payer: BC Managed Care – PPO | Source: Ambulatory Visit | Attending: General Surgery | Admitting: General Surgery

## 2020-01-17 ENCOUNTER — Ambulatory Visit (HOSPITAL_BASED_OUTPATIENT_CLINIC_OR_DEPARTMENT_OTHER): Payer: BC Managed Care – PPO | Admitting: Anesthesiology

## 2020-01-17 ENCOUNTER — Ambulatory Visit (HOSPITAL_BASED_OUTPATIENT_CLINIC_OR_DEPARTMENT_OTHER)
Admission: RE | Admit: 2020-01-17 | Discharge: 2020-01-17 | Disposition: A | Discharge status: Home / Self Care | Payer: BC Managed Care – PPO | Attending: General Surgery | Admitting: General Surgery

## 2020-01-17 ENCOUNTER — Encounter (HOSPITAL_BASED_OUTPATIENT_CLINIC_OR_DEPARTMENT_OTHER): Payer: Self-pay | Admitting: General Surgery

## 2020-01-17 ENCOUNTER — Other Ambulatory Visit: Payer: Self-pay

## 2020-01-17 ENCOUNTER — Encounter (HOSPITAL_BASED_OUTPATIENT_CLINIC_OR_DEPARTMENT_OTHER)
Admission: RE | Disposition: A | Discharge status: Home / Self Care | Payer: Self-pay | Source: Home / Self Care | Attending: General Surgery

## 2020-01-17 ENCOUNTER — Ambulatory Visit
Admission: RE | Admit: 2020-01-17 | Discharge: 2020-01-17 | Disposition: A | Discharge status: Home / Self Care | Payer: BC Managed Care – PPO | Source: Ambulatory Visit | Attending: General Surgery | Admitting: General Surgery

## 2020-01-17 DIAGNOSIS — Z20822 Contact with and (suspected) exposure to covid-19: Secondary | ICD-10-CM | POA: Insufficient documentation

## 2020-01-17 DIAGNOSIS — C50412 Malignant neoplasm of upper-outer quadrant of left female breast: Secondary | ICD-10-CM | POA: Diagnosis not present

## 2020-01-17 DIAGNOSIS — Z170 Estrogen receptor positive status [ER+]: Secondary | ICD-10-CM

## 2020-01-17 HISTORY — DX: Nausea with vomiting, unspecified: R11.2

## 2020-01-17 HISTORY — PX: BREAST LUMPECTOMY WITH RADIOACTIVE SEED AND SENTINEL LYMPH NODE BIOPSY: SHX6550

## 2020-01-17 HISTORY — DX: Other specified postprocedural states: Z98.890

## 2020-01-17 HISTORY — PX: BREAST LUMPECTOMY: SHX2

## 2020-01-17 SURGERY — BREAST LUMPECTOMY WITH RADIOACTIVE SEED AND SENTINEL LYMPH NODE BIOPSY
Anesthesia: General | Site: Breast | Laterality: Left

## 2020-01-17 MED ORDER — PROMETHAZINE HCL 25 MG/ML IJ SOLN
12.5000 mg | Freq: Once | INTRAMUSCULAR | Status: DC | PRN
Start: 2020-01-17 — End: 2020-01-17

## 2020-01-17 MED ORDER — DEXAMETHASONE SODIUM PHOSPHATE 10 MG/ML IJ SOLN
INTRAMUSCULAR | Status: AC
  Filled 2020-01-17: qty 1

## 2020-01-17 MED ORDER — ACETAMINOPHEN 500 MG PO TABS
ORAL_TABLET | ORAL | Status: AC
  Filled 2020-01-17: qty 2

## 2020-01-17 MED ORDER — TRAMADOL HCL 50 MG PO TABS: 50.0000 mg | ORAL_TABLET | Freq: Four times a day (QID) | ORAL | 0 refills | Status: DC | PRN

## 2020-01-17 MED ORDER — FENTANYL CITRATE (PF) 100 MCG/2ML IJ SOLN
100.0000 ug | Freq: Once | INTRAMUSCULAR | Status: AC
  Administered 2020-01-17: 12:00:00 100 ug via INTRAVENOUS

## 2020-01-17 MED ORDER — CEFAZOLIN SODIUM-DEXTROSE 2-4 GM/100ML-% IV SOLN
2.0000 g | INTRAVENOUS | Status: AC
  Administered 2020-01-17: 13:00:00 2 g via INTRAVENOUS

## 2020-01-17 MED ORDER — BUPIVACAINE HCL (PF) 0.25 % IJ SOLN
INTRAMUSCULAR | Status: DC | PRN
  Administered 2020-01-17: 8 mL

## 2020-01-17 MED ORDER — LACTATED RINGERS IV SOLN: INTRAVENOUS | Status: DC

## 2020-01-17 MED ORDER — TECHNETIUM TC 99M TILMANOCEPT KIT
1.0000 | PACK | Freq: Once | INTRAVENOUS | Status: AC | PRN
  Administered 2020-01-17: 1 via INTRADERMAL

## 2020-01-17 MED ORDER — PROPOFOL 10 MG/ML IV BOLUS
INTRAVENOUS | Status: DC | PRN
  Administered 2020-01-17: 120 mg via INTRAVENOUS

## 2020-01-17 MED ORDER — KETOROLAC TROMETHAMINE 15 MG/ML IJ SOLN
15.0000 mg | INTRAMUSCULAR | Status: AC
  Administered 2020-01-17: 11:00:00 15 mg via INTRAVENOUS

## 2020-01-17 MED ORDER — CEFAZOLIN SODIUM-DEXTROSE 2-4 GM/100ML-% IV SOLN
INTRAVENOUS | Status: AC
  Filled 2020-01-17: qty 100

## 2020-01-17 MED ORDER — MEPERIDINE HCL 25 MG/ML IJ SOLN: 6.2500 mg | INTRAMUSCULAR | Status: DC | PRN

## 2020-01-17 MED ORDER — ENSURE PRE-SURGERY PO LIQD: 296.0000 mL | Freq: Once | ORAL | Status: DC

## 2020-01-17 MED ORDER — FENTANYL CITRATE (PF) 100 MCG/2ML IJ SOLN
INTRAMUSCULAR | Status: AC
  Filled 2020-01-17: qty 2

## 2020-01-17 MED ORDER — MIDAZOLAM HCL 2 MG/2ML IJ SOLN
2.0000 mg | Freq: Once | INTRAMUSCULAR | Status: AC
  Administered 2020-01-17: 12:00:00 2 mg via INTRAVENOUS

## 2020-01-17 MED ORDER — FENTANYL CITRATE (PF) 100 MCG/2ML IJ SOLN
INTRAMUSCULAR | Status: DC | PRN
  Administered 2020-01-17: 50 ug via INTRAVENOUS

## 2020-01-17 MED ORDER — LIDOCAINE 2% (20 MG/ML) 5 ML SYRINGE
INTRAMUSCULAR | Status: AC
  Filled 2020-01-17: qty 5

## 2020-01-17 MED ORDER — OXYCODONE HCL 5 MG PO TABS: 5.0000 mg | ORAL_TABLET | Freq: Once | ORAL | Status: DC | PRN

## 2020-01-17 MED ORDER — HYDROMORPHONE HCL 1 MG/ML IJ SOLN: 0.2500 mg | INTRAMUSCULAR | Status: DC | PRN

## 2020-01-17 MED ORDER — EPHEDRINE 5 MG/ML INJ
INTRAVENOUS | Status: AC
  Filled 2020-01-17: qty 10

## 2020-01-17 MED ORDER — LIDOCAINE HCL (CARDIAC) PF 100 MG/5ML IV SOSY
PREFILLED_SYRINGE | INTRAVENOUS | Status: DC | PRN
  Administered 2020-01-17: 40 mg via INTRATRACHEAL

## 2020-01-17 MED ORDER — DROPERIDOL 2.5 MG/ML IJ SOLN
INTRAMUSCULAR | Status: AC
  Filled 2020-01-17: qty 2

## 2020-01-17 MED ORDER — PROPOFOL 10 MG/ML IV BOLUS
INTRAVENOUS | Status: AC
  Filled 2020-01-17: qty 20

## 2020-01-17 MED ORDER — ONDANSETRON HCL 4 MG/2ML IJ SOLN
INTRAMUSCULAR | Status: DC | PRN
  Administered 2020-01-17: 4 mg via INTRAVENOUS

## 2020-01-17 MED ORDER — DROPERIDOL 2.5 MG/ML IJ SOLN
INTRAMUSCULAR | Status: DC | PRN
  Administered 2020-01-17: .625 mg via INTRAVENOUS

## 2020-01-17 MED ORDER — KETOROLAC TROMETHAMINE 15 MG/ML IJ SOLN
INTRAMUSCULAR | Status: AC
  Filled 2020-01-17: qty 1

## 2020-01-17 MED ORDER — DEXAMETHASONE SODIUM PHOSPHATE 10 MG/ML IJ SOLN
INTRAMUSCULAR | Status: DC | PRN
  Administered 2020-01-17: 10 mg via INTRAVENOUS

## 2020-01-17 MED ORDER — ROPIVACAINE HCL 5 MG/ML IJ SOLN
INTRAMUSCULAR | Status: DC | PRN
  Administered 2020-01-17: 30 mL via PERINEURAL

## 2020-01-17 MED ORDER — OXYCODONE HCL 5 MG/5ML PO SOLN: 5.0000 mg | Freq: Once | ORAL | Status: DC | PRN

## 2020-01-17 MED ORDER — EPHEDRINE SULFATE 50 MG/ML IJ SOLN
INTRAMUSCULAR | Status: DC | PRN
Start: 2020-01-17 — End: 2020-01-17
  Administered 2020-01-17 (×3): 5 mg via INTRAVENOUS

## 2020-01-17 MED ORDER — ONDANSETRON HCL 4 MG/2ML IJ SOLN
INTRAMUSCULAR | Status: AC
  Filled 2020-01-17: qty 2

## 2020-01-17 MED ORDER — MIDAZOLAM HCL 2 MG/2ML IJ SOLN
INTRAMUSCULAR | Status: AC
  Filled 2020-01-17: qty 2

## 2020-01-17 MED ORDER — ACETAMINOPHEN 500 MG PO TABS
1000.0000 mg | ORAL_TABLET | ORAL | Status: AC
  Administered 2020-01-17: 11:00:00 1000 mg via ORAL

## 2020-01-17 SURGICAL SUPPLY — 64 items
ADH SKN CLS APL DERMABOND .7 (GAUZE/BANDAGES/DRESSINGS) ×1
APL PRP STRL LF DISP 70% ISPRP (MISCELLANEOUS) ×1
APPLIER CLIP 11 MED OPEN (CLIP) ×3
APPLIER CLIP 9.375 MED OPEN (MISCELLANEOUS)
APR CLP MED 11 20 MLT OPN (CLIP) ×1
APR CLP MED 9.3 20 MLT OPN (MISCELLANEOUS)
BINDER BREAST LRG (GAUZE/BANDAGES/DRESSINGS) ×2 IMPLANT
BINDER BREAST MEDIUM (GAUZE/BANDAGES/DRESSINGS) IMPLANT
BINDER BREAST XLRG (GAUZE/BANDAGES/DRESSINGS) IMPLANT
BINDER BREAST XXLRG (GAUZE/BANDAGES/DRESSINGS) IMPLANT
BLADE SURG 15 STRL LF DISP TIS (BLADE) ×1 IMPLANT
BLADE SURG 15 STRL SS (BLADE) ×3
CANISTER SUC SOCK COL 7IN (MISCELLANEOUS) IMPLANT
CANISTER SUCT 1200ML W/VALVE (MISCELLANEOUS) IMPLANT
CHLORAPREP W/TINT 26 (MISCELLANEOUS) ×3 IMPLANT
CLIP APPLIE 11 MED OPEN (CLIP) IMPLANT
CLIP APPLIE 9.375 MED OPEN (MISCELLANEOUS) IMPLANT
CLIP VESOCCLUDE SM WIDE 6/CT (CLIP) ×1 IMPLANT
CLOSURE WOUND 1/2 X4 (GAUZE/BANDAGES/DRESSINGS) ×1
COVER BACK TABLE 60X90IN (DRAPES) ×3 IMPLANT
COVER MAYO STAND STRL (DRAPES) ×3 IMPLANT
COVER PROBE W GEL 5X96 (DRAPES) ×3 IMPLANT
COVER WAND RF STERILE (DRAPES) IMPLANT
DECANTER SPIKE VIAL GLASS SM (MISCELLANEOUS) ×2 IMPLANT
DERMABOND ADVANCED (GAUZE/BANDAGES/DRESSINGS) ×2
DERMABOND ADVANCED .7 DNX12 (GAUZE/BANDAGES/DRESSINGS) ×1 IMPLANT
DRAPE LAPAROSCOPIC ABDOMINAL (DRAPES) ×3 IMPLANT
DRAPE UTILITY XL STRL (DRAPES) ×3 IMPLANT
ELECT COATED BLADE 2.86 ST (ELECTRODE) ×3 IMPLANT
ELECT REM PT RETURN 9FT ADLT (ELECTROSURGICAL) ×3
ELECTRODE REM PT RTRN 9FT ADLT (ELECTROSURGICAL) ×1 IMPLANT
GLOVE BIO SURGEON STRL SZ7 (GLOVE) ×6 IMPLANT
GLOVE BIOGEL PI IND STRL 7.5 (GLOVE) ×1 IMPLANT
GLOVE BIOGEL PI INDICATOR 7.5 (GLOVE) ×2
GOWN STRL REUS W/ TWL LRG LVL3 (GOWN DISPOSABLE) ×2 IMPLANT
GOWN STRL REUS W/TWL LRG LVL3 (GOWN DISPOSABLE) ×6
HEMOSTAT ARISTA ABSORB 3G PWDR (HEMOSTASIS) IMPLANT
KIT MARKER MARGIN INK (KITS) ×3 IMPLANT
NDL HYPO 25X1 1.5 SAFETY (NEEDLE) ×1 IMPLANT
NDL SAFETY ECLIPSE 18X1.5 (NEEDLE) IMPLANT
NEEDLE HYPO 18GX1.5 SHARP (NEEDLE)
NEEDLE HYPO 25X1 1.5 SAFETY (NEEDLE) ×3 IMPLANT
NS IRRIG 1000ML POUR BTL (IV SOLUTION) ×2 IMPLANT
PACK BASIN DAY SURGERY FS (CUSTOM PROCEDURE TRAY) ×3 IMPLANT
PENCIL SMOKE EVACUATOR (MISCELLANEOUS) ×3 IMPLANT
RETRACTOR ONETRAX LX 90X20 (MISCELLANEOUS) IMPLANT
SLEEVE SCD COMPRESS KNEE MED (MISCELLANEOUS) ×3 IMPLANT
SPONGE LAP 4X18 RFD (DISPOSABLE) ×5 IMPLANT
STRIP CLOSURE SKIN 1/2X4 (GAUZE/BANDAGES/DRESSINGS) ×2 IMPLANT
SUT ETHILON 2 0 FS 18 (SUTURE) IMPLANT
SUT MNCRL AB 4-0 PS2 18 (SUTURE) ×5 IMPLANT
SUT MON AB 5-0 PS2 18 (SUTURE) ×2 IMPLANT
SUT SILK 2 0 SH (SUTURE) ×4 IMPLANT
SUT VIC AB 2-0 SH 27 (SUTURE) ×6
SUT VIC AB 2-0 SH 27XBRD (SUTURE) ×1 IMPLANT
SUT VIC AB 3-0 SH 27 (SUTURE) ×6
SUT VIC AB 3-0 SH 27X BRD (SUTURE) ×1 IMPLANT
SUT VIC AB 5-0 PS2 18 (SUTURE) IMPLANT
SYR CONTROL 10ML LL (SYRINGE) ×3 IMPLANT
TOWEL GREEN STERILE FF (TOWEL DISPOSABLE) ×3 IMPLANT
TRAY FAXITRON CT DISP (TRAY / TRAY PROCEDURE) ×3 IMPLANT
TUBE CONNECTING 20'X1/4 (TUBING)
TUBE CONNECTING 20X1/4 (TUBING) IMPLANT
YANKAUER SUCT BULB TIP NO VENT (SUCTIONS) IMPLANT

## 2020-01-17 NOTE — Discharge Instructions (Signed)
[facility] Office Phone Number [phone]  BREAST BIOPSY/ PARTIAL MASTECTOMY: POST OP INSTRUCTIONS Take 400 mg of ibuprofen every 8 hours or 650 mg tylenol every 6 hours for next 72 hours then as needed. Use ice several times daily also. Always review your discharge instruction sheet given to you by the facility where your surgery was performed.  IF YOU HAVE DISABILITY OR FAMILY LEAVE FORMS, YOU MUST BRING THEM TO THE OFFICE FOR PROCESSING.  DO NOT GIVE THEM TO YOUR DOCTOR.  1. A prescription for pain medication may be given to you upon discharge.  Take your pain medication as prescribed, if needed.  If narcotic pain medicine is not needed, then you may take acetaminophen (Tylenol), naprosyn (Alleve) or ibuprofen (Advil) as needed. 2. Take your usually prescribed medications unless otherwise directed 3. If you need a refill on your pain medication, please contact your pharmacy.  They will contact our office to request authorization.  Prescriptions will not be filled after 5pm or on week-ends. 4. You should eat very light the first 24 hours after surgery, such as soup, crackers, pudding, etc.  Resume your normal diet the day after surgery. 5. Most patients will experience some swelling and bruising in the breast.  Ice packs and a good support bra will help.  Wear the breast binder provided or a sports bra for 72 hours day and night.  After that wear a sports bra during the day until you return to the office. Swelling and bruising can take several days to resolve.  6. It is common to experience some constipation if taking pain medication after surgery.  Increasing fluid intake and taking a stool softener will usually help or prevent this problem from occurring.  A mild laxative (Milk of Magnesia or Miralax) should be taken according to package directions if there are no bowel movements after 48 hours. 7. Unless discharge instructions indicate otherwise, you may remove your bandages 48  hours after surgery and you may shower at that time.  You may have steri-strips (small skin tapes) in place directly over the incision.  These strips should be left on the skin for 7-10 days and will come off on their own.  If your surgeon used skin glue on the incision, you may shower in 24 hours.  The glue will flake off over the next 2-3 weeks.  Any sutures or staples will be removed at the office during your follow-up visit. 8. ACTIVITIES:  You may resume regular daily activities (gradually increasing) beginning the next day.  Wearing a good support bra or sports bra minimizes pain and swelling.  You may have sexual intercourse when it is comfortable. a. You may drive when you no longer are taking prescription pain medication, you can comfortably wear a seatbelt, and you can safely maneuver your car and apply brakes. b. RETURN TO WORK:  ______________________________________________________________________________________ 9. You should see your doctor in the office for a follow-up appointment approximately two weeks after your surgery.  Your doctor's nurse will typically make your follow-up appointment when she calls you with your pathology report.  Expect your pathology report 3-4 business days after your surgery.  You may call to check if you do not hear from us after three days. 10. OTHER INSTRUCTIONS: _______________________________________________________________________________________________ _____________________________________________________________________________________________________________________________________ _____________________________________________________________________________________________________________________________________ _____________________________________________________________________________________________________________________________________  WHEN TO CALL DR WAKEFIELD: 1. Fever over 101.0 2. Nausea and/or vomiting. 3. Extreme swelling or  bruising. 4. Continued bleeding from incision. 5. Increased pain, redness, or drainage from the incision.  The clinic   staff is available to answer your questions during regular business hours.  Please don't hesitate to call and ask to speak to one of the nurses for clinical concerns.  If you have a medical emergency, go to the nearest emergency room or call 911.  A surgeon from [facility] is always on call at the hospital.  For further questions, please visit [email or web address]   No Tylenol or Ibuprofen/Motrin until 5:12 pm   Post Anesthesia Home Care Instructions  Activity: Get plenty of rest for the remainder of the day. A responsible individual must stay with you for 24 hours following the procedure.  For the next 24 hours, DO NOT: -Drive a car -Paediatric nurse -Drink alcoholic beverages -Take any medication unless instructed by your physician -Make any legal decisions or sign important papers.  Meals: Start with liquid foods such as gelatin or soup. Progress to regular foods as tolerated. Avoid greasy, spicy, heavy foods. If nausea and/or vomiting occur, drink only clear liquids until the nausea and/or vomiting subsides. Call your physician if vomiting continues.  Special Instructions/Symptoms: Your throat may feel dry or sore from the anesthesia or the breathing tube placed in your throat during surgery. If this causes discomfort, gargle with warm salt water. The discomfort should disappear within 24 hours.  If you had a scopolamine patch placed behind your ear for the management of post- operative nausea and/or vomiting:  1. The medication in the patch is effective for 72 hours, after which it should be removed.  Wrap patch in a tissue and discard in the trash. Wash hands thoroughly with soap and water. 2. You may remove the patch earlier than 72 hours if you experience unpleasant side effects which may include dry mouth, dizziness or visual  disturbances. 3. Avoid touching the patch. Wash your hands with soap and water after contact with the patch.

## 2020-01-17 NOTE — H&P (Signed)
49 yof here with her spouse Dr Bobbye Charleston for new left breast cancer. she has no prior breast history. she has no mass or dc. she has fh in her mom at age 49 but no other family members with any cancers. she underwent screening mm that shows c density breasts. there is in uo left breast a persistent distortion about 2 cm. US showed at 1 oclock 2 cm from nipple a 1.3x0.8x0.8 cm mass. [place] of left axilla showed one node with a 4 mm cortex. biopsy of the node is benign concordant. 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 she is here to discuss options today  Diagnostic Studies History Mallie Snooks, [place]; 01/10/2020 8:20 AM) Colonoscopy  never  Allergies Mallie Snooks, CMA; 01/10/2020 8:21 AM) No Known Drug Allergies  [01/10/2020]: Allergies Reconciled   Medication History Mallie Snooks, [place]; 01/10/2020 8:21 AM) No Current Medications Medications Reconciled  Other Problems Mallie Snooks, [place]; 01/10/2020 8:20 AM) Breast Cancer  General anesthesia - complications  Lump In Breast    Review of Systems Mallie Snooks CMA; 01/10/2020 8:20 AM) HEENT Present- Wears glasses/contact lenses. Not Present- Earache, Hearing Loss, Hoarseness, Nose Bleed, Oral Ulcers, Ringing in the Ears, Seasonal Allergies, Sinus Pain, Sore Throat, Visual Disturbances and Yellow Eyes.  Vitals Mallie Snooks CMA; 01/10/2020 8:21 AM) 01/10/2020 8:21 AM Weight: 137.5 lb Height: 66in Body Surface Area: 1.71 m Body Mass Index: 22.19 kg/m  Temp.: 98.14F  Pulse: 92 (Regular)  P.OX: 99% (Room air) BP: 100/70(Sitting, Left Arm, Standard) Physical Exam Rolm Bookbinder MD; 01/10/2020 9:31 AM) General Mental Status-Alert. Orientation-Oriented X3.  Breast Nipples-No Discharge. Breast Lump-No Palpable Breast Mass.  Lymphatic Head & Neck  General Head & Neck Lymphatics: Bilateral - Description -  Normal. Axillary  General Axillary Region: Bilateral - Description - Normal. Note: no [place] adenopathy   Assessment & Plan Rolm Bookbinder MD; 01/10/2020 9:39 AM) BREAST CANCER OF UPPER-OUTER QUADRANT OF LEFT FEMALE BREAST (C50.412) Story: Left breast seed guided lumpectomy, left axillary sentinel node biopsy We discussed the staging and pathophysiology of breast cancer. We discussed all of the different options for treatment for breast cancer including surgery, chemotherapy, radiation therapy, Herceptin, and antiestrogen therapy. We discussed a sentinel lymph node biopsy as she does not appear to having lymph node involvement right now. We discussed the performance of that with injection of radioactive tracer. We discussed up to a 5% risk lifetime of chronic shoulder pain as well as lymphedema associated with a sentinel lymph node biopsy. We discussed the options for treatment of the breast cancer which included lumpectomy versus a mastectomy. We discussed the performance of the lumpectomy with radioactive seed placement. We discussed a 5-10% chance of a positive margin requiring reexcision in the operating room. We also discussed that she will need radiation therapy if she undergoes lumpectomy. We discussed mastectomy and the postoperative care for that as well. Mastectomy can be followed by reconstruction. The decision for lumpectomy vs mastectomy has no impact on decision for chemotherapy. Most mastectomy patients will not need radiation therapy. We discussed that there is no difference in her survival whether she undergoes lumpectomy with radiation therapy or antiestrogen therapy versus a mastectomy. There is also no real difference between her recurrence in the breast. We discussed oncotype post surgery for chemo decision. we discussed doesn't meet indication for genetic testing and I dont think mri is needed We discussed the risks of operation including  bleeding, infection, possible  reoperation. She understands her further therapy will be based on what her stages at the time of her operation.

## 2020-01-17 NOTE — H&P (View-Only) (Signed)
49 yof here with her spouse Dr Bobbye [place] for new left breast cancer. she has no prior breast history. she has no mass or dc. she has fh in her mom at age 49 but no other family members with any cancers. she underwent screening mm that shows c density breasts. there is in uo left breast a persistent distortion about 2 cm. US showed at 1 oclock 2 cm from nipple a 1.3x0.8x0.8 cm mass. [place] of left axilla showed one node with a 4 mm cortex. biopsy of the node is benign concordant. 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 she is here to discuss options today  Diagnostic Studies History Mallie Snooks, [place]; 01/10/2020 8:20 AM) Colonoscopy  never  Allergies Mallie Snooks, CMA; 01/10/2020 8:21 AM) No Known Drug Allergies  [01/10/2020]: Allergies Reconciled   Medication History Mallie Snooks, [place]; 01/10/2020 8:21 AM) No Current Medications Medications Reconciled  Other Problems Mallie Snooks, [place]; 01/10/2020 8:20 AM) Breast Cancer  General anesthesia - complications  Lump In Breast    Review of Systems Mallie Snooks CMA; 01/10/2020 8:20 AM) HEENT Present- Wears glasses/contact lenses. Not Present- Earache, Hearing Loss, Hoarseness, Nose Bleed, Oral Ulcers, Ringing in the Ears, Seasonal Allergies, Sinus Pain, Sore Throat, Visual Disturbances and Yellow Eyes.  Vitals Mallie Snooks CMA; 01/10/2020 8:21 AM) 01/10/2020 8:21 AM Weight: 137.5 lb Height: 66in Body Surface Area: 1.71 m Body Mass Index: 22.19 kg/m  Temp.: 98.37F  Pulse: 92 (Regular)  P.OX: 99% (Room air) BP: 100/70(Sitting, Left Arm, Standard) Physical Exam Rolm Bookbinder MD; 01/10/2020 9:31 AM) General Mental Status-Alert. Orientation-Oriented X3.  Breast Nipples-No Discharge. Breast Lump-No Palpable Breast Mass.  Lymphatic Head & Neck  General Head & Neck Lymphatics: Bilateral - Description -  Normal. Axillary  General Axillary Region: Bilateral - Description - Normal. Note: no [place] adenopathy   Assessment & Plan Rolm Bookbinder MD; 01/10/2020 9:39 AM) BREAST CANCER OF UPPER-OUTER QUADRANT OF LEFT FEMALE BREAST (C50.412) Story: Left breast seed guided lumpectomy, left axillary sentinel node biopsy We discussed the staging and pathophysiology of breast cancer. We discussed all of the different options for treatment for breast cancer including surgery, chemotherapy, radiation therapy, Herceptin, and antiestrogen therapy. We discussed a sentinel lymph node biopsy as she does not appear to having lymph node involvement right now. We discussed the performance of that with injection of radioactive tracer. We discussed up to a 5% risk lifetime of chronic shoulder pain as well as lymphedema associated with a sentinel lymph node biopsy. We discussed the options for treatment of the breast cancer which included lumpectomy versus a mastectomy. We discussed the performance of the lumpectomy with radioactive seed placement. We discussed a 5-10% chance of a positive margin requiring reexcision in the operating room. We also discussed that she will need radiation therapy if she undergoes lumpectomy. We discussed mastectomy and the postoperative care for that as well. Mastectomy can be followed by reconstruction. The decision for lumpectomy vs mastectomy has no impact on decision for chemotherapy. Most mastectomy patients will not need radiation therapy. We discussed that there is no difference in her survival whether she undergoes lumpectomy with radiation therapy or antiestrogen therapy versus a mastectomy. There is also no real difference between her recurrence in the breast. We discussed oncotype post surgery for chemo decision. we discussed doesn't meet indication for genetic testing and I dont think mri is needed We discussed the risks of operation including  bleeding, infection, possible  reoperation. She understands her further therapy will be based on what her stages at the time of her operation.

## 2020-01-17 NOTE — Anesthesia Postprocedure Evaluation (Signed)
Anesthesia Post Note  Patient: Financial controller  Procedure(s) Performed: LEFT BREAST LUMPECTOMY WITH RADIOACTIVE SEED AND LEFT AXILLARY SENTINEL LYMPH NODE BIOPSY (Left Breast)     Patient location during evaluation: PACU Anesthesia Type: General Level of consciousness: awake and alert Pain management: pain level controlled Vital Signs Assessment: post-procedure vital signs reviewed and stable Respiratory status: spontaneous breathing, nonlabored ventilation and respiratory function stable Cardiovascular status: blood pressure returned to baseline and stable Postop Assessment: no apparent nausea or vomiting Anesthetic complications: no   No complications documented.  Last Vitals:  Vitals:   01/17/20 1445 01/17/20 1500  BP: 107/71 106/76  Pulse: 73 67  Resp: 20 20  Temp:  36.5 C  SpO2: 100% 100%    Last Pain:  Vitals:   01/17/20 1500  TempSrc:   PainSc: 2                  Lynda Rainwater

## 2020-01-17 NOTE — Anesthesia Procedure Notes (Signed)
Procedure Name: LMA Insertion Date/Time: 01/17/2020 1:19 PM Performed by: Glory Buff, CRNA Pre-anesthesia Checklist: Patient identified, Emergency Drugs available, Suction available and Patient being monitored Patient Re-evaluated:Patient Re-evaluated prior to induction Oxygen Delivery Method: Circle system utilized Preoxygenation: Pre-oxygenation with 100% oxygen Induction Type: IV induction Ventilation: Mask ventilation without difficulty LMA: LMA inserted LMA Size: 4.0 Number of attempts: 1 Placement Confirmation: positive ETCO2 Tube secured with: Tape Dental Injury: Teeth and Oropharynx as per pre-operative assessment

## 2020-01-17 NOTE — Interval H&P Note (Signed)
History and Physical Interval Note:  01/17/2020 1:00 PM  Dieterich  has presented today for surgery, with the diagnosis of LEFT BREAST CANCER.  The various methods of treatment have been discussed with the patient and family. After consideration of risks, benefits and other options for treatment, the patient has consented to  Procedure(s) with comments: LEFT BREAST LUMPECTOMY WITH RADIOACTIVE SEED AND LEFT AXILLARY SENTINEL LYMPH NODE BIOPSY (Left) - PECTORAL BLOCK as a surgical intervention.  The patient's history has been reviewed, patient examined, no change in status, stable for surgery.  I have reviewed the patient's chart and labs.  Questions were answered to the patient's satisfaction.     Rolm Bookbinder

## 2020-01-17 NOTE — Progress Notes (Signed)
Assisted Dr. Miller with left, ultrasound guided, pectoralis block. Side rails up, monitors on throughout procedure. See vital signs in flow sheet. Tolerated Procedure well. 

## 2020-01-17 NOTE — Anesthesia Procedure Notes (Signed)
Anesthesia Regional Block: Pectoralis block   Pre-Anesthetic Checklist: ,, timeout performed, Correct Patient, Correct Site, Correct Laterality, Correct Procedure, Correct Position, site marked, Risks and benefits discussed,  Surgical consent,  Pre-op evaluation,  At surgeon's request and post-op pain management  Laterality: Left  Prep: chloraprep       Needles:  Injection technique: Single-shot  Needle Type: Stimiplex     Needle Length: 9cm  Needle Gauge: 21     Additional Needles:   Procedures:,,,, ultrasound used (permanent image in chart),,,,  Narrative:  Start time: 01/17/2020 12:18 PM End time: 01/17/2020 12:23 PM Injection made incrementally with aspirations every 5 mL.  Performed by: Personally  Anesthesiologist: Lynda Rainwater, MD

## 2020-01-17 NOTE — Op Note (Signed)
Preoperative diagnosis: Clinical stage I left breast cancer Postoperative diagnosis: Same as above Procedure: 1.  left breast radioactive seed guided lumpectomy 2.  Left deep axillary sentinel lymph node biopsy Surgeon: Dr. Serita Grammes Anesthesia: General with pectoral block Specimens: 1.  Left breast tissue marked with paint containing seed and clip 2.  Additional medial, posterior, inferior, superio margins left breast lumpectomy marked short stitch superior, long stitch lateral, double stitch deep 3.  left deep axillary sentinel lymph nodes Estimated blood loss: Minimal Complications: None Drains: None Sponge and count was correct at completion Disposition recovery stable condition  Indications:48 yof here with her spouse Dr Bobbye [place] for new left breast cancer. she has no prior breast history. she has no mass or dc. she has fh in her mom at age 50 but no other family members with any cancers. she underwent screening mm that shows c density breasts. there is in uo left breast a persistent distortion about 2 cm. US showed at 1 oclock 2 cm from nipple a 1.3x0.8x0.8 cm mass. [place] of left axilla showed one node with a 4 mm cortex. biopsy of the node is benign concordant. 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 We elected to proceed with lumpectomy/sn biopsy  Procedure: After informed consent was obtained the patient first had a radioactive seed placed.  I had these mammograms available in the operating room.  She underwent a pectoral block.  SCDs were placed.  Antibiotics were given.  She was placed under general anesthesia with an LMA.  She was prepped and draped in the standard sterile surgical fashion.  Surgical timeout was then performed.  The seed was in central breast. I infiltrated marcaine and made a periareolar incision.   I then used the neoprobe to guide the excision of the seed and the surrounding  tissue with an attempt to get a clear margin.  The posterior margin is now the pectoralis muscle.  Mammogram confirmed removal of the clip and the seed.  3D CT images showed that I was close in a couple of margins so I removed these and marked these as above.  I then closed down this cavity with 2-0 Vicryl.  I did place clips. I then closed skin with 3-0 vicryl, 5-0 monocryl and glue.   I then infiltrated marcaine below the axillary hairline and made an incision.  I went through the fascia and entered the axilla.  I then located several small nodes.    The sentinel node was identified with the highest count of 590.  This was removed and there was no background radioactivity.  Hemostasis was observed.  I closed the axillary fascia with 2-0 Vicryl.  The skin was closed with 3-0 Vicryl and the 4-0 Monocryl.   Glue and Steri-Strips were applied to both incisions.  A binder was placed.  She tolerated well was extubated and transferred to recovery stable.

## 2020-01-17 NOTE — Transfer of Care (Signed)
Immediate Anesthesia Transfer of Care Note  Patient: Shelly [name]  Procedure(s) Performed: LEFT BREAST LUMPECTOMY WITH RADIOACTIVE SEED AND LEFT AXILLARY SENTINEL LYMPH NODE BIOPSY (Left Breast)  Patient Location: PACU  Anesthesia Type:GA combined with regional for post-op pain  Level of Consciousness: awake and alert   Airway & Oxygen Therapy: Patient Spontanous Breathing and Patient connected to face mask oxygen  Post-op Assessment: Report given to RN and Post -op Vital signs reviewed and stable  Post vital signs: Reviewed and stable  Last Vitals:  Vitals Value Taken Time  BP    Temp 36.6 C 01/17/20 1425  Pulse 68 01/17/20 1425  Resp    SpO2 100 % 01/17/20 1425    Last Pain:  Vitals:   01/17/20 1105  TempSrc: Oral  PainSc: 0-No pain         Complications: No complications documented.

## 2020-01-17 NOTE — Anesthesia Preprocedure Evaluation (Addendum)
Anesthesia Evaluation  Patient identified by MRN, date of birth, ID band Patient awake    Reviewed: Allergy & Precautions, NPO status , Patient's Chart, lab work & pertinent test results  History of Anesthesia Complications (+) PONV  Airway Mallampati: I  TM Distance: >3 FB Neck ROM: Full    Dental  (+) Teeth Intact, Dental Advisory Given   Pulmonary former smoker,    Pulmonary exam normal        Cardiovascular Normal cardiovascular exam     Neuro/Psych    GI/Hepatic   Endo/Other    Renal/GU      Musculoskeletal  (+) Arthritis , Osteoarthritis,    Abdominal   Peds  Hematology   Anesthesia Other Findings Breast Cancer  Reproductive/Obstetrics                             Anesthesia Physical  Anesthesia Plan  ASA: III  Anesthesia Plan: General   Post-op Pain Management:  Regional for Post-op pain   Induction: Intravenous  PONV Risk Score and Plan: 4 or greater and Ondansetron, Dexamethasone, Midazolam, Droperidol and Treatment may vary due to age or medical condition  Airway Management Planned: LMA  Additional Equipment:   Intra-op Plan:   Post-operative Plan: Extubation in OR  Informed Consent: I have reviewed the patients History and Physical, chart, labs and discussed the procedure including the risks, benefits and alternatives for the proposed anesthesia with the patient or authorized representative who has indicated his/her understanding and acceptance.     Dental advisory given  Plan Discussed with: CRNA and Surgeon  Anesthesia Plan Comments:         Anesthesia Quick Evaluation

## 2020-01-20 ENCOUNTER — Encounter (HOSPITAL_BASED_OUTPATIENT_CLINIC_OR_DEPARTMENT_OTHER): Payer: Self-pay | Admitting: General Surgery

## 2020-01-21 LAB — SURGICAL PATHOLOGY

## 2020-01-22 ENCOUNTER — Encounter: Payer: Self-pay | Admitting: *Deleted

## 2020-01-22 ENCOUNTER — Other Ambulatory Visit: Payer: Self-pay | Admitting: General Surgery

## 2020-01-22 ENCOUNTER — Telehealth: Payer: Self-pay | Admitting: Hematology and Oncology

## 2020-01-22 ENCOUNTER — Telehealth: Payer: Self-pay | Admitting: *Deleted

## 2020-01-22 NOTE — Telephone Encounter (Signed)
Ordered oncotype per Dr. Gudena. Faxed requisition to pathology. °

## 2020-01-22 NOTE — Telephone Encounter (Signed)
I called and left a message stating that there is an issue with the lateral margin and that she might require additional surgery in January.  We have sent for Oncotype DX test result which will inform us whether she needs chemotherapy.

## 2020-01-30 ENCOUNTER — Ambulatory Visit: Payer: BC Managed Care – PPO | Attending: Obstetrics | Admitting: Physical Therapy

## 2020-01-30 ENCOUNTER — Ambulatory Visit: Payer: BC Managed Care – PPO | Admitting: Rehabilitation

## 2020-01-30 ENCOUNTER — Other Ambulatory Visit: Payer: Self-pay

## 2020-01-30 DIAGNOSIS — Z483 Aftercare following surgery for neoplasm: Secondary | ICD-10-CM | POA: Insufficient documentation

## 2020-01-30 NOTE — Patient Instructions (Addendum)
Www.klosetraining.com Courses Online Strength After Breast Cancer Look at the right of the page for Lymphedema Education Session   First of all, check with your insurance company to see if provider is in network    [facility] (for wigs and compression sleeves / gloves/gauntlets )  [address] [place], [place] [address] [phone]  Will file some insurances --- call for appointment   Second to [facility] (for mastectomy prosthetics and garments) [address] [place], [place] [address] [phone] Will file some insurances --- call for appointment  [facility]  [address]  [place], [place] [address] [phone] Lower extremity garments  [facility] [address] [place], [place]  [address] [phone]   [facility] (for compression garments, especilly for lower extremities) [address] [place], [place]  [address] [phone] Call for appointment    Jodelle Red ,certified fitter [facility]  [phone]  [facility] (for mastectomy supplies and garments) [address] [place], [place] [address] [phone]  Other Resources: National Lymphedema Network:  www.lymphnet.org www.Klosetraining.com for patient articles and self manual lymph drainage information www.lymphedemablog.com has informative articles.  LiteracyChannel.hu.com www.lymphedemaproducts.com www.brightlifedirect.com

## 2020-01-30 NOTE — Therapy (Signed)
[place], [place], [address] Phone: [phone]   Fax:  [phone]  Physical Therapy Evaluation  Patient Details  Name: Shelly [name] MRN: [id number] Date of Birth: [birth year] Referring Provider (PT): Dr. Donne Hazel   Encounter Date: 01/30/2020   PT End of Session - 01/30/20 1703    Visit Number 1    Number of Visits 1    Date for PT Re-Evaluation --   one time visit   PT Start Time 1600    PT Stop Time 1645    PT Time Calculation (min) 45 min    Activity Tolerance Patient tolerated treatment well    Behavior During Therapy [facility] for tasks assessed/performed           Past Medical History:  Diagnosis Date  . 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 No pertinent past medical history   . PONV (postoperative nausea and vomiting)     Past Surgical History:  Procedure Laterality Date  . BREAST LUMPECTOMY WITH RADIOACTIVE SEED AND SENTINEL LYMPH NODE BIOPSY Left 01/17/2020   Procedure: LEFT BREAST LUMPECTOMY WITH RADIOACTIVE SEED AND LEFT AXILLARY SENTINEL LYMPH NODE BIOPSY;  Surgeon: Rolm Bookbinder, MD;  Location: [place];  Service: General;  Laterality: Left;  PECTORAL BLOCK  . CYSTOSCOPY  01/02/2012   Procedure: CYSTOSCOPY;  Surgeon: Peri Maris, MD;  Location: [facility];  Service: Gynecology;  Laterality: N/A;  . DILATION AND CURETTAGE OF UTERUS    . HYSTEROSCOPY    . OPEN REDUCTION INTERNAL FIXATION (ORIF) DISTAL RADIAL FRACTURE Left 03/27/2016   Procedure: OPEN REDUCTION INTERNAL FIXATION (ORIF) DISTAL RADIAL FRACTURE;  Surgeon: Iran Planas, MD;  Location: [place];  Service: Orthopedics;  Laterality: Left;  . ROBOTIC ASSISTED TOTAL HYSTERECTOMY  01/02/2012   Procedure: ROBOTIC ASSISTED TOTAL HYSTERECTOMY;  Surgeon: Peri Maris, MD;  Location: [facility];  Service: Gynecology;  Laterality: N/A;  Roboitc Bilateral Salpingectomy   . TONSILLECTOMY    . uterine  ablation      There were no vitals filed for this visit.    Subjective Assessment - 01/30/20 1611    Subjective Pt says she is healing up pretty well. 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 She will have radiation and will find out if she has to have chemo based on oncotype. Pt to have a second surgery on January 17 to get clear margins.   past history of left wrist surgery that is completely healed    Patient Stated Goals To find out what she needs to know    Currently in Pain? No/denies              [facility] PT Assessment - 01/30/20 0001      Assessment   Medical Diagnosis left breast cancer    Referring Provider (PT) Dr. Donne Hazel    Onset Date/Surgical Date 01/17/20    Hand Dominance Right      Precautions   Precautions Other (comment)   at risk for lymphedema     Restrictions   Weight Bearing Restrictions No      Balance Screen   Has the patient fallen in the past 6 months No    Has the patient had a decrease in activity level  because of a fear of falling?  No    Is the patient reluctant to leave their home because of a fear of falling?  No      Home Ecologist residence    Living Arrangements Spouse/significant other      Prior Function   Level of Independence Independent    Vocation Part time employment    Vocation Requirements works at a Nurse, mental health 3 times a week , sometimes walks, also hikes      Cognition   Overall Cognitive Status Within Functional Limits for tasks assessed      Observation/Other Assessments   Observations well healing incision around [place] of left breast , dressing over axillary incision not removed but pt states it is healing well    Other Surveys  Quick Dash    Quick DASH   6.82      Sensation   Light Touch Impaired by gross assessment   pt reports numbness at lateral chest     Coordination   Gross Motor Movements are Fluid and Coordinated Yes      Posture/Postural Control   Posture/Postural Control No significant limitations      ROM / Strength   AROM / PROM / Strength AROM;Strength      AROM   Overall AROM  Within functional limits for tasks performed      Strength   Overall Strength Within functional limits for tasks performed      Palpation   Palpation comment mild fullness at left axilla at node excision site. No cording detected in axilla or down arm             LYMPHEDEMA/ONCOLOGY QUESTIONNAIRE - 01/30/20 0001      Type   Cancer Type left breast cancer      Surgeries   Lumpectomy Date 01/17/20    Number Lymph Nodes Removed 5      Treatment   Active Chemotherapy Treatment No    Past Chemotherapy Treatment No    Active Radiation Treatment No    Past Radiation Treatment No    Current Hormone Treatment No    Past Hormone Therapy No      What other symptoms do you have   Are you Having Heaviness or Tightness No    Are you having Pain No    Are you having pitting edema No    Is it Hard or Difficult finding clothes that fit No    Do you have infections No    Is there Decreased scar mobility --   did not test due to recent surgery   Stemmer Sign No      Lymphedema Assessments   Lymphedema Assessments Upper extremities      Right Upper Extremity Lymphedema   10 cm Proximal to Olecranon Process 26 cm    Olecranon Process 23 cm    15 cm Proximal to Ulnar Styloid Process 23 cm    Just Proximal to Ulnar Styloid Process 13.8 cm    Across Hand at [employer] 17 cm    At [place] of 2nd Digit 5.5 cm      Left Upper Extremity Lymphedema   10 cm Proximal to Olecranon Process 25.5 cm    Olecranon Process 23 cm    15 cm Proximal to Ulnar Styloid Process 22.5 cm    Just Proximal to Ulnar Styloid Process 13.4 cm    Across Hand at  [employer]  Space 16.3 cm    At [place] of 2nd Digit 5 cm                 Katina Dung - 01/30/20 0001    Open a tight or new jar No difficulty    Do heavy household chores (wash walls, wash floors) No difficulty    Carry a shopping bag or briefcase No difficulty    Wash your back No difficulty    Use a knife to cut food No difficulty    Recreational activities in which you take some force or impact through your arm, shoulder, or hand (golf, hammering, tennis) No difficulty    During the past week, to what extent has your arm, shoulder or hand problem interfered with your normal social activities with family, friends, neighbors, or groups? Slightly    During the past week, to what extent has your arm, shoulder or hand problem limited your work or other regular daily activities Not at all    Arm, shoulder, or hand pain. Mild    Tingling (pins and needles) in your arm, shoulder, or hand None    Difficulty Sleeping Mild difficulty    DASH Score 6.82 %            Objective measurements completed on examination: See above findings.       [facility] Adult PT Treatment/Exercise - 01/30/20 0001      Self-Care   Self-Care Other Self-Care Comments    Other Self-Care Comments  pt gives script and information about where to get a compression bra and compression sleeve if needed Also suggested that she place a rolled towel in axilla at incision to help with mild fullness there      Exercises   Exercises Other Exercises    Other Exercises  pt given Strength ABC handouts to begin in February and instructed to start low and progress slow. pt is familiar with exercises and acknowledges how to progress them                       PT Long Term Goals - 01/30/20 1710      PT LONG TERM GOAL #1   Title Pt will be able to verbalize exercise program for strenth to return to normal function after surgery    Time 1    Period Days    Status Achieved      PT LONG TERM GOAL #2   Title Pt  wil verbalize understanding of how to schedule ABC class to learn more about lymphedema risk reduction    Time 1    Period Days    Status Achieved      PT LONG TERM GOAL #3   Title Pt will know where to get a  compression bra and sleeve if needed for lympehdem prophylaxis    Time 1    Period Days    Status Achieved                  Plan - 01/30/20 1703    Clinical Impression Statement Pt comes in 2 weeks after lumpectomy.  She is healing well and has full shoulder ROM.  She only has mild fullness at node excision site and not signs of cording, post op swelling or lymphedema.  She was given information about ABC class ( with handouts) where to get a compression sleeve and bra if needed, and the Strength ABC handouts to start in February once she is  well healed from next surgery. She acknowledges she will start low and progress slowly.  Pt is a knowledgable exerciser , golfer and active lifestyle.  Do not feel that she will need additional PT ,but she knows to call and come back if she needs to    Personal Factors and Comorbidities Comorbidity 1    Comorbidities cancer surgery    Stability/Clinical Decision Making Stable/Uncomplicated    Clinical Decision Making Low    Rehab Potential Excellent    PT Frequency One time visit    PT Treatment/Interventions Patient/family education;Therapeutic exercise    PT Next Visit Plan one time visit    Consulted and Agree with Plan of Care Patient           Patient will benefit from skilled therapeutic intervention in order to improve the following deficits and impairments:  Decreased knowledge of precautions,Decreased knowledge of use of DME,Decreased scar mobility  Visit Diagnosis: Aftercare following surgery for neoplasm     Problem List Patient Active Problem List   Diagnosis Date Noted  . Malignant neoplasm of upper-outer quadrant of left breast in female, estrogen receptor positive ([place]) 01/10/2020  . Other bursal cyst, left  ankle and foot 03/04/2017  . Lateral epicondylitis of right elbow 03/02/2017  . Gait abnormality 05/18/2016  . Closed fracture of sesamoid bone of foot 04/06/2016   Donato Heinz. Owens Shark PT  Norwood Levo 01/30/2020, 5:12 PM  [place], [place], [address] Phone: [phone]   Fax:  [phone]  Name: Shelly [name] MRN: [id number] Date of Birth: May 27, 1970

## 2020-02-05 ENCOUNTER — Encounter: Payer: Self-pay | Admitting: *Deleted

## 2020-02-05 ENCOUNTER — Other Ambulatory Visit (HOSPITAL_COMMUNITY)
Admission: RE | Admit: 2020-02-05 | Discharge: 2020-02-05 | Disposition: A | Discharge status: Home / Self Care | Payer: BC Managed Care – PPO | Source: Ambulatory Visit | Attending: General Surgery | Admitting: General Surgery

## 2020-02-05 ENCOUNTER — Telehealth: Payer: Self-pay | Admitting: *Deleted

## 2020-02-05 ENCOUNTER — Other Ambulatory Visit: Payer: Self-pay

## 2020-02-05 ENCOUNTER — Encounter (HOSPITAL_BASED_OUTPATIENT_CLINIC_OR_DEPARTMENT_OTHER): Payer: Self-pay | Admitting: General Surgery

## 2020-02-05 DIAGNOSIS — Z170 Estrogen receptor positive status [ER+]: Secondary | ICD-10-CM | POA: Diagnosis not present

## 2020-02-05 DIAGNOSIS — Z20822 Contact with and (suspected) exposure to covid-19: Secondary | ICD-10-CM | POA: Insufficient documentation

## 2020-02-05 DIAGNOSIS — Z803 Family history of malignant neoplasm of breast: Secondary | ICD-10-CM | POA: Diagnosis not present

## 2020-02-05 DIAGNOSIS — C50912 Malignant neoplasm of unspecified site of left female breast: Secondary | ICD-10-CM | POA: Diagnosis present

## 2020-02-05 DIAGNOSIS — Z01812 Encounter for preprocedural laboratory examination: Secondary | ICD-10-CM | POA: Insufficient documentation

## 2020-02-05 NOTE — Progress Notes (Signed)

## 2020-02-05 NOTE — Telephone Encounter (Signed)
Reveived oncotype results of 15/4%.

## 2020-02-06 ENCOUNTER — Ambulatory Visit (HOSPITAL_BASED_OUTPATIENT_CLINIC_OR_DEPARTMENT_OTHER): Payer: BC Managed Care – PPO | Admitting: Anesthesiology

## 2020-02-06 ENCOUNTER — Other Ambulatory Visit: Payer: Self-pay

## 2020-02-06 ENCOUNTER — Ambulatory Visit (HOSPITAL_BASED_OUTPATIENT_CLINIC_OR_DEPARTMENT_OTHER)
Admission: RE | Admit: 2020-02-06 | Discharge: 2020-02-06 | Disposition: A | Discharge status: Home / Self Care | Payer: BC Managed Care – PPO | Attending: General Surgery | Admitting: General Surgery

## 2020-02-06 ENCOUNTER — Encounter (HOSPITAL_BASED_OUTPATIENT_CLINIC_OR_DEPARTMENT_OTHER)
Admission: RE | Disposition: A | Discharge status: Home / Self Care | Payer: Self-pay | Source: Home / Self Care | Attending: General Surgery

## 2020-02-06 ENCOUNTER — Encounter (HOSPITAL_BASED_OUTPATIENT_CLINIC_OR_DEPARTMENT_OTHER): Payer: Self-pay | Admitting: General Surgery

## 2020-02-06 DIAGNOSIS — Z803 Family history of malignant neoplasm of breast: Secondary | ICD-10-CM | POA: Insufficient documentation

## 2020-02-06 DIAGNOSIS — C50912 Malignant neoplasm of unspecified site of left female breast: Secondary | ICD-10-CM | POA: Diagnosis not present

## 2020-02-06 DIAGNOSIS — Z20822 Contact with and (suspected) exposure to covid-19: Secondary | ICD-10-CM | POA: Insufficient documentation

## 2020-02-06 DIAGNOSIS — Z170 Estrogen receptor positive status [ER+]: Secondary | ICD-10-CM | POA: Insufficient documentation

## 2020-02-06 HISTORY — PX: RE-EXCISION OF BREAST LUMPECTOMY: SHX6048

## 2020-02-06 LAB — SARS CORONAVIRUS 2 (TAT 6-24 HRS): SARS Coronavirus 2: NEGATIVE

## 2020-02-06 SURGERY — EXCISION, LESION, BREAST
Anesthesia: General | Site: Breast | Laterality: Left

## 2020-02-06 MED ORDER — FENTANYL CITRATE (PF) 100 MCG/2ML IJ SOLN
INTRAMUSCULAR | Status: AC
  Filled 2020-02-06: qty 2

## 2020-02-06 MED ORDER — LIDOCAINE 2% (20 MG/ML) 5 ML SYRINGE
INTRAMUSCULAR | Status: AC
  Filled 2020-02-06: qty 5

## 2020-02-06 MED ORDER — DEXAMETHASONE SODIUM PHOSPHATE 10 MG/ML IJ SOLN
INTRAMUSCULAR | Status: DC | PRN
  Administered 2020-02-06: 10 mg via INTRAVENOUS

## 2020-02-06 MED ORDER — CEFAZOLIN SODIUM-DEXTROSE 2-4 GM/100ML-% IV SOLN
2.0000 g | INTRAVENOUS | Status: AC
  Administered 2020-02-06: 2 g via INTRAVENOUS

## 2020-02-06 MED ORDER — DEXAMETHASONE SODIUM PHOSPHATE 10 MG/ML IJ SOLN
INTRAMUSCULAR | Status: AC
  Filled 2020-02-06: qty 1

## 2020-02-06 MED ORDER — KETOROLAC TROMETHAMINE 15 MG/ML IJ SOLN
INTRAMUSCULAR | Status: AC
  Filled 2020-02-06: qty 1

## 2020-02-06 MED ORDER — MIDAZOLAM HCL 5 MG/5ML IJ SOLN
INTRAMUSCULAR | Status: DC | PRN
  Administered 2020-02-06: 2 mg via INTRAVENOUS

## 2020-02-06 MED ORDER — ACETAMINOPHEN 500 MG PO TABS
ORAL_TABLET | ORAL | Status: AC
  Filled 2020-02-06: qty 2

## 2020-02-06 MED ORDER — BUPIVACAINE HCL (PF) 0.25 % IJ SOLN
INTRAMUSCULAR | Status: DC | PRN
  Administered 2020-02-06: 10 mL

## 2020-02-06 MED ORDER — FENTANYL CITRATE (PF) 100 MCG/2ML IJ SOLN
INTRAMUSCULAR | Status: DC | PRN
  Administered 2020-02-06: 50 ug via INTRAVENOUS

## 2020-02-06 MED ORDER — LACTATED RINGERS IV SOLN: INTRAVENOUS | Status: DC

## 2020-02-06 MED ORDER — BUPIVACAINE HCL (PF) 0.25 % IJ SOLN
INTRAMUSCULAR | Status: AC
  Filled 2020-02-06: qty 30

## 2020-02-06 MED ORDER — ONDANSETRON HCL 4 MG/2ML IJ SOLN
INTRAMUSCULAR | Status: AC
  Filled 2020-02-06: qty 2

## 2020-02-06 MED ORDER — MIDAZOLAM HCL 2 MG/2ML IJ SOLN
INTRAMUSCULAR | Status: AC
  Filled 2020-02-06: qty 2

## 2020-02-06 MED ORDER — CEFAZOLIN SODIUM-DEXTROSE 2-4 GM/100ML-% IV SOLN
INTRAVENOUS | Status: AC
  Filled 2020-02-06: qty 100

## 2020-02-06 MED ORDER — KETOROLAC TROMETHAMINE 15 MG/ML IJ SOLN
15.0000 mg | INTRAMUSCULAR | Status: AC
  Administered 2020-02-06: 15 mg via INTRAVENOUS

## 2020-02-06 MED ORDER — PROPOFOL 10 MG/ML IV BOLUS
INTRAVENOUS | Status: AC
  Filled 2020-02-06: qty 20

## 2020-02-06 MED ORDER — LIDOCAINE HCL (PF) 1 % IJ SOLN
INTRAMUSCULAR | Status: AC
  Filled 2020-02-06: qty 30

## 2020-02-06 MED ORDER — PROPOFOL 500 MG/50ML IV EMUL
INTRAVENOUS | Status: DC | PRN
  Administered 2020-02-06: 25 ug/kg/min via INTRAVENOUS

## 2020-02-06 MED ORDER — ACETAMINOPHEN 500 MG PO TABS
1000.0000 mg | ORAL_TABLET | ORAL | Status: AC
  Administered 2020-02-06: 1000 mg via ORAL

## 2020-02-06 MED ORDER — ONDANSETRON HCL 4 MG/2ML IJ SOLN
INTRAMUSCULAR | Status: DC | PRN
  Administered 2020-02-06: 4 mg via INTRAVENOUS

## 2020-02-06 MED ORDER — LIDOCAINE HCL (CARDIAC) PF 100 MG/5ML IV SOSY
PREFILLED_SYRINGE | INTRAVENOUS | Status: DC | PRN
  Administered 2020-02-06: 60 mg via INTRATRACHEAL

## 2020-02-06 SURGICAL SUPPLY — 54 items
ADH SKN CLS APL DERMABOND .7 (GAUZE/BANDAGES/DRESSINGS) ×1
APL PRP STRL LF DISP 70% ISPRP (MISCELLANEOUS) ×1
BINDER BREAST LRG (GAUZE/BANDAGES/DRESSINGS) IMPLANT
BINDER BREAST MEDIUM (GAUZE/BANDAGES/DRESSINGS) ×1 IMPLANT
BINDER BREAST XLRG (GAUZE/BANDAGES/DRESSINGS) IMPLANT
BINDER BREAST XXLRG (GAUZE/BANDAGES/DRESSINGS) IMPLANT
BLADE SURG 15 STRL LF DISP TIS (BLADE) ×1 IMPLANT
BLADE SURG 15 STRL SS (BLADE) ×2
CANISTER SUCT 1200ML W/VALVE (MISCELLANEOUS) ×2 IMPLANT
CHLORAPREP W/TINT 26 (MISCELLANEOUS) ×2 IMPLANT
CLIP VESOCCLUDE SM WIDE 6/CT (CLIP) ×1 IMPLANT
COVER BACK TABLE 60X90IN (DRAPES) ×2 IMPLANT
COVER MAYO STAND STRL (DRAPES) ×2 IMPLANT
COVER WAND RF STERILE (DRAPES) IMPLANT
DECANTER SPIKE VIAL GLASS SM (MISCELLANEOUS) IMPLANT
DERMABOND ADVANCED (GAUZE/BANDAGES/DRESSINGS) ×1
DERMABOND ADVANCED .7 DNX12 (GAUZE/BANDAGES/DRESSINGS) IMPLANT
DRAPE LAPAROSCOPIC ABDOMINAL (DRAPES) ×2 IMPLANT
DRAPE UTILITY XL STRL (DRAPES) ×2 IMPLANT
DRSG TEGADERM 4X4.75 (GAUZE/BANDAGES/DRESSINGS) ×2 IMPLANT
ELECT COATED BLADE 2.86 ST (ELECTRODE) ×2 IMPLANT
ELECT REM PT RETURN 9FT ADLT (ELECTROSURGICAL) ×2
ELECTRODE REM PT RTRN 9FT ADLT (ELECTROSURGICAL) ×1 IMPLANT
GAUZE SPONGE 4X4 12PLY STRL LF (GAUZE/BANDAGES/DRESSINGS) ×2 IMPLANT
GLOVE BIOGEL PI IND STRL 7.5 (GLOVE) ×1 IMPLANT
GLOVE BIOGEL PI INDICATOR 7.5 (GLOVE) ×1
GLOVE SURG ENC MOIS LTX SZ7 (GLOVE) ×2 IMPLANT
GOWN STRL REUS W/ TWL LRG LVL3 (GOWN DISPOSABLE) ×3 IMPLANT
GOWN STRL REUS W/TWL LRG LVL3 (GOWN DISPOSABLE) ×6
HEMOSTAT ARISTA ABSORB 3G PWDR (HEMOSTASIS) IMPLANT
KIT MARKER MARGIN INK (KITS) IMPLANT
NDL HYPO 25X1 1.5 SAFETY (NEEDLE) ×1 IMPLANT
NEEDLE HYPO 25X1 1.5 SAFETY (NEEDLE) ×2 IMPLANT
NS IRRIG 1000ML POUR BTL (IV SOLUTION) ×1 IMPLANT
PACK BASIN DAY SURGERY FS (CUSTOM PROCEDURE TRAY) ×2 IMPLANT
PENCIL SMOKE EVACUATOR (MISCELLANEOUS) ×2 IMPLANT
RETRACTOR ONETRAX LX 90X20 (MISCELLANEOUS) IMPLANT
SLEEVE SCD COMPRESS KNEE MED (MISCELLANEOUS) ×2 IMPLANT
SPONGE LAP 4X18 RFD (DISPOSABLE) ×2 IMPLANT
STRIP CLOSURE SKIN 1/2X4 (GAUZE/BANDAGES/DRESSINGS) ×1 IMPLANT
SUT MNCRL AB 3-0 PS2 18 (SUTURE) IMPLANT
SUT MNCRL AB 4-0 PS2 18 (SUTURE) IMPLANT
SUT MON AB 5-0 PS2 18 (SUTURE) ×1 IMPLANT
SUT SILK 2 0 SH (SUTURE) IMPLANT
SUT VIC AB 2-0 SH 27 (SUTURE) ×4
SUT VIC AB 2-0 SH 27XBRD (SUTURE) ×1 IMPLANT
SUT VIC AB 3-0 SH 27 (SUTURE) ×4
SUT VIC AB 3-0 SH 27X BRD (SUTURE) ×1 IMPLANT
SUT VIC AB 5-0 PS2 18 (SUTURE) IMPLANT
SUT VICRYL AB 3 0 TIES (SUTURE) IMPLANT
SYR CONTROL 10ML LL (SYRINGE) ×2 IMPLANT
TOWEL GREEN STERILE FF (TOWEL DISPOSABLE) ×2 IMPLANT
TUBE CONNECTING 20X1/4 (TUBING) ×2 IMPLANT
YANKAUER SUCT BULB TIP NO VENT (SUCTIONS) ×2 IMPLANT

## 2020-02-06 NOTE — Interval H&P Note (Signed)
History and Physical Interval Note:  02/06/2020 10:56 AM  Shelly [name]  has presented today for surgery, with the diagnosis of LEFT BREAST CANCER.  The various methods of treatment have been discussed with the patient and family. After consideration of risks, benefits and other options for treatment, the patient has consented to  Procedure(s): RE-EXCISION OF LEFT BREAST LUMPECTOMY (Left) as a surgical intervention.  The patient's history has been reviewed, patient examined, no change in status, stable for surgery.  I have reviewed the patient's chart and labs.  Questions were answered to the patient's satisfaction.     Emelia Loron

## 2020-02-06 NOTE — Anesthesia Procedure Notes (Signed)
Procedure Name: LMA Insertion Date/Time: 02/06/2020 11:18 AM Performed by: Thornell Mule, CRNA Pre-anesthesia Checklist: Patient identified, Emergency Drugs available, Suction available and Patient being monitored Patient Re-evaluated:Patient Re-evaluated prior to induction Oxygen Delivery Method: Circle system utilized Preoxygenation: Pre-oxygenation with 100% oxygen Induction Type: IV induction LMA: LMA inserted LMA Size: 4.0 Number of attempts: 1 Placement Confirmation: positive ETCO2 Tube secured with: Tape Dental Injury: Teeth and Oropharynx as per pre-operative assessment

## 2020-02-06 NOTE — Op Note (Signed)
Preoperative diagnosis: Clinical stage I left breast cancer s/p lumpectomy with pos lateral margin, oncotype 15 Postoperative diagnosis: Same as above Procedure: re-excision left breast lateral margin Surgeon: Dr. Harden Mo Anesthesia: General with pectoral block Specimens:Lateral margin left breast lumpectomy marked short stitch superior, long stitch lateral, double stitch deep Estimated blood loss: Minimal Complications: None Drains: None Sponge and count was correct at completion Disposition recovery stable condition  Indications:48 yof here with her spouse Dr Carrington Clamp for new left breast cancer. she has no prior breast history. she has no mass or dc. she has fh in her mom at age 55 but no other family members with any cancers. she underwent screening mm that shows c density breasts. there is in uo left breast a persistent distortion about 2 cm. US showed at 1 oclock 2 cm from nipple a 1.3x0.8x0.8 cm mass. [place] of left axilla showed one node with a 4 mm cortex. biopsy of the node is benign concordant. 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 We elected to proceed with lumpectomy/sn biopsy. Her final path is IDC, node negative oncotype of 15 with pos lateral margin. I discussed reexcision.   Procedure: After informed consent she was taken to the OR.  SCDs were placed. Antibiotics were given. She was placed under general anesthesia with an LMA. She was prepped and draped in the standard sterile surgical fashion. Surgical timeout was then performed.  I infiltrated marcaine.  I reentered the same periareolar incision and then released my sutures. I then excised the lateral margin and marked as above.Hemostasis was obtained. I then closed down this cavity with 2-0 Vicryl.  I then closed skin with 3-0 vicryl, 5-0 monocryl and glue.  Glue and Steri-Strips were applied. A binder was placed. She tolerated well  was extubated and transferred to recovery stable.

## 2020-02-06 NOTE — Transfer of Care (Signed)
Immediate Anesthesia Transfer of Care Note  Patient: Shelly [name]  Procedure(s) Performed: RE-EXCISION OF LEFT BREAST LUMPECTOMY (Left Breast)  Patient Location: PACU  Anesthesia Type:General  Level of Consciousness: drowsy, patient cooperative and responds to stimulation  Airway & Oxygen Therapy: Patient Spontanous Breathing and Patient connected to face mask oxygen  Post-op Assessment: Report given to RN and Post -op Vital signs reviewed and stable  Post vital signs: Reviewed and stable  Last Vitals:  Vitals Value Taken Time  BP 90/57 02/06/20 1154  Temp    Pulse 60 02/06/20 1155  Resp 12 02/06/20 1155  SpO2 100 % 02/06/20 1155  Vitals shown include unvalidated device data.  Last Pain:  Vitals:   02/06/20 0908  TempSrc: Oral  PainSc: 0-No pain         Complications: No complications documented.

## 2020-02-06 NOTE — Discharge Instructions (Signed)
[place] Office Phone Number [phone]  BREAST BIOPSY/ PARTIAL MASTECTOMY: POST OP INSTRUCTIONS Take 400 mg of ibuprofen every 8 hours or 650 mg tylenol every 6 hours for next 72 hours then as needed. Use ice several times daily also. Always review your discharge instruction sheet given to you by the facility where your surgery was performed.  IF YOU HAVE DISABILITY OR FAMILY LEAVE FORMS, YOU MUST BRING THEM TO THE OFFICE FOR PROCESSING.  DO NOT GIVE THEM TO YOUR DOCTOR.  1. A prescription for pain medication may be given to you upon discharge.  Take your pain medication as prescribed, if needed.  If narcotic pain medicine is not needed, then you may take acetaminophen (Tylenol), naprosyn (Alleve) or ibuprofen (Advil) as needed. 2. Take your usually prescribed medications unless otherwise directed 3. If you need a refill on your pain medication, please contact your pharmacy.  They will contact our office to request authorization.  Prescriptions will not be filled after 5pm or on week-ends. 4. You should eat very light the first 24 hours after surgery, such as soup, crackers, pudding, etc.  Resume your normal diet the day after surgery. 5. Most patients will experience some swelling and bruising in the breast.  Ice packs and a good support bra will help.  Wear the breast binder provided or a sports bra for 72 hours day and night.  After that wear a sports bra during the day until you return to the office. Swelling and bruising can take several days to resolve.  6. It is common to experience some constipation if taking pain medication after surgery.  Increasing fluid intake and taking a stool softener will usually help or prevent this problem from occurring.  A mild laxative (Milk of Magnesia or Miralax) should be taken according to package directions if there are no bowel movements after 48 hours. 7. Unless discharge instructions indicate otherwise, you may remove your bandages 48  hours after surgery and you may shower at that time.  You may have steri-strips (small skin tapes) in place directly over the incision.  These strips should be left on the skin for 7-10 days and will come off on their own.  If your surgeon used skin glue on the incision, you may shower in 24 hours.  The glue will flake off over the next 2-3 weeks.  Any sutures or staples will be removed at the office during your follow-up visit. 8. ACTIVITIES:  You may resume regular daily activities (gradually increasing) beginning the next day.  Wearing a good support bra or sports bra minimizes pain and swelling.  You may have sexual intercourse when it is comfortable. a. You may drive when you no longer are taking prescription pain medication, you can comfortably wear a seatbelt, and you can safely maneuver your car and apply brakes. b. RETURN TO WORK:  ______________________________________________________________________________________ 9. You should see your doctor in the office for a follow-up appointment approximately two weeks after your surgery.  Your doctors nurse will typically make your follow-up appointment when she calls you with your pathology report.  Expect your pathology report 3-4 business days after your surgery.  You may call to check if you do not hear from [place] after three days. 10. OTHER INSTRUCTIONS: _______________________________________________________________________________________________ _____________________________________________________________________________________________________________________________________ _____________________________________________________________________________________________________________________________________ _____________________________________________________________________________________________________________________________________  WHEN TO CALL DR WAKEFIELD: 1. Fever over 101.0 2. Nausea and/or vomiting. 3. Extreme swelling or  bruising. 4. Continued bleeding from incision. 5. Increased pain, redness, or drainage from the incision.  The clinic  staff is available to answer your questions during regular business hours.  Please dont hesitate to call and ask to speak to one of the nurses for clinical concerns.  If you have a medical emergency, go to the nearest emergency room or call 911.  A surgeon from [facility] is always on call at the hospital.  For further questions, please visit [email or web address]   Post Anesthesia Home Care Instructions  Activity: Get plenty of rest for the remainder of the day. A responsible individual must stay with you for 24 hours following the procedure.  For the next 24 hours, DO NOT: -Drive a car -Advertising copywriter -Drink alcoholic beverages -Take any medication unless instructed by your physician -Make any legal decisions or sign important papers.  Meals: Start with liquid foods such as gelatin or soup. Progress to regular foods as tolerated. Avoid greasy, spicy, heavy foods. If nausea and/or vomiting occur, drink only clear liquids until the nausea and/or vomiting subsides. Call your physician if vomiting continues.  Special Instructions/Symptoms: Your throat may feel dry or sore from the anesthesia or the breathing tube placed in your throat during surgery. If this causes discomfort, gargle with warm salt water. The discomfort should disappear within 24 hours.  If you had a scopolamine patch placed behind your ear for the management of post- operative nausea and/or vomiting:  1. The medication in the patch is effective for 72 hours, after which it should be removed.  Wrap patch in a tissue and discard in the trash. Wash hands thoroughly with soap and water. 2. You may remove the patch earlier than 72 hours if you experience unpleasant side effects which may include dry mouth, dizziness or visual disturbances. 3. Avoid touching the patch. Wash your hands  with soap and water after contact with the patch.     NO TYLENOL OR IBUPROFEN UNTIL AFTER 3:15pm ON 02/06/19.

## 2020-02-06 NOTE — Anesthesia Preprocedure Evaluation (Addendum)
Anesthesia Evaluation  Patient identified by MRN, date of birth, ID band Patient awake    Reviewed: Allergy & Precautions, NPO status , Patient's Chart, lab work & pertinent test results  History of Anesthesia Complications (+) PONV and history of anesthetic complications  Airway Mallampati: I  TM Distance: >3 FB Neck ROM: Full    Dental  (+) Teeth Intact, Dental Advisory Given   Pulmonary former smoker,    Pulmonary exam normal        Cardiovascular Normal cardiovascular exam     Neuro/Psych    GI/Hepatic   Endo/Other    Renal/GU      Musculoskeletal  (+) Arthritis , Osteoarthritis,    Abdominal   Peds  Hematology   Anesthesia Other Findings Breast Cancer  Reproductive/Obstetrics                            Anesthesia Physical  Anesthesia Plan  ASA: III  Anesthesia Plan: General   Post-op Pain Management:    Induction: Intravenous  PONV Risk Score and Plan: 4 or greater and Ondansetron, Dexamethasone, Midazolam, Treatment may vary due to age or medical condition and Propofol infusion  Airway Management Planned: LMA  Additional Equipment:   Intra-op Plan:   Post-operative Plan: Extubation in OR  Informed Consent: I have reviewed the patients History and Physical, chart, labs and discussed the procedure including the risks, benefits and alternatives for the proposed anesthesia with the patient or authorized representative who has indicated his/her understanding and acceptance.     Dental advisory given  Plan Discussed with: CRNA, Surgeon and Anesthesiologist  Anesthesia Plan Comments:        Anesthesia Quick Evaluation

## 2020-02-06 NOTE — Anesthesia Postprocedure Evaluation (Signed)
Anesthesia Post Note  Patient: [employer]  Procedure(s) Performed: RE-EXCISION OF LEFT BREAST LUMPECTOMY (Left Breast)     Patient location during evaluation: PACU Anesthesia Type: General Level of consciousness: awake and alert Pain management: pain level controlled Vital Signs Assessment: post-procedure vital signs reviewed and stable Respiratory status: spontaneous breathing, nonlabored ventilation, respiratory function stable and patient connected to nasal cannula oxygen Cardiovascular status: blood pressure returned to baseline and stable Postop Assessment: no apparent nausea or vomiting Anesthetic complications: no   No complications documented.  Last Vitals:  Vitals:   02/06/20 1215 02/06/20 1228  BP: 96/65 105/75  Pulse: (!) 53 (!) 58  Resp: 13 16  Temp:  36.5 C  SpO2: 99% 100%    Last Pain:  Vitals:   02/06/20 1228  TempSrc:   PainSc: 0-No pain                 Harpreet Signore

## 2020-02-07 ENCOUNTER — Encounter (HOSPITAL_BASED_OUTPATIENT_CLINIC_OR_DEPARTMENT_OTHER): Payer: Self-pay | Admitting: General Surgery

## 2020-02-10 ENCOUNTER — Encounter: Payer: Self-pay | Admitting: *Deleted

## 2020-02-10 LAB — SURGICAL PATHOLOGY

## 2020-02-13 ENCOUNTER — Other Ambulatory Visit (HOSPITAL_COMMUNITY): Payer: BC Managed Care – PPO

## 2020-02-14 ENCOUNTER — Encounter: Payer: Self-pay | Admitting: Hematology and Oncology

## 2020-02-17 ENCOUNTER — Institutional Professional Consult (permissible substitution): Payer: BC Managed Care – PPO | Admitting: Radiation Oncology

## 2020-02-27 NOTE — Progress Notes (Signed)
Location of Breast Cancer:Malignant neoplasm of upper-outer quadrant of left breast in female, estrogen receptor positive   Histology per Pathology Report:  02-06-2020 Clinical History: Left breast cancer (cm)  FINAL MICROSCOPIC DIAGNOSIS:   A. BREAST, LEFT, RE-EXCISION OF LATERAL MARGIN:  - Prior procedure site changes. No residual carcinoma identified.   GROSS DESCRIPTION:   Received fresh is 2.8 x 2.4 x 1.3 cm of fibroadipose tissue, clinically  left breast lumpectomy re-excision of lateral margin. The specimen is  placed in formalin at 12:50 PM on 02/06/2020. The specimen is oriented  with a short suture at superior, long suture at lateral and double  suture at deep. The lateral margin is inked black prior to sectioning.  The cut surface consists of soft yellow adipose tissue and white fibrous  tissue. Tumor is not grossly identified. The specimen is sectioned and  entirely submitted in 7 cassettes. (GRP 02/07/2020)   01/17/2020   Clinical History: left breast cancer (cm)    FINAL MICROSCOPIC DIAGNOSIS:   A. BREAST, LEFT, LUMPECTOMY:  - Invasive ductal carcinoma, Nottingham grade 2 of 3, 2.1 cm  - Ductal carcinoma in-situ, high grade  - Carcinoma extends to the junction of the superior and lateral margins  (see parts G-J below)  - Previous biopsy site changes present  - See oncology table and comment below   B. LYMPH NODE, LEFT, SENTINEL, EXCISION:  - No carcinoma identified in one lymph node (0/1)   C. LYMPH NODE, LEFT, SENTINEL, EXCISION:  - No carcinoma identified in one lymph node (0/1)   D. LYMPH NODE, LEFT, SENTINEL, EXCISION:  - No carcinoma identified in one lymph node (0/1)   E. LYMPH NODE, LEFT, SENTINEL, EXCISION:  - No carcinoma identified in one lymph node (0/1)   F. LYMPH NODE, LEFT, SENTINEL, EXCISION:  - No carcinoma identified in one lymph node (0/1)   G. BREAST, LEFT ADDITIONAL POSTERIOR MARGIN, EXCISION:  - Benign breast tissue   - No residual carcinoma identified   H. BREAST, LEFT ADDITIONAL INFERIOR MARGIN, EXCISION:  - Benign breast tissue  - No residual carcinoma identified   I. BREAST, LEFT ADDITIONAL SUPERIOR MARGIN, EXCISION:  - Benign breast tissue  - No residual carcinoma identified   J. BREAST, LEFT ADDITIONAL MEDIAL MARGIN, EXCISION:  - Benign breast tissue  - No residual carcinoma identified   ONCOLOGY TABLE:   INVASIVE CARCINOMA OF THE BREAST: Resection   Procedure: Excision  Specimen Laterality: Left  Histologic Type: Invasive carcinoma of no special type (ductal, NOS)  Histologic Grade:    Glandular (Acinar)/Tubular Differentiation: Score 3    Nuclear Pleomorphism: Score 3    Mitotic Rate: Score 1    Overall Grade: Nottingham grade 2  Tumor Size: 2.1 cm  Ductal Carcinoma In Situ: Present  Tumor Extent: Limited to breast parenchyma  Treatment Effect in the Breast: No known presurgical therapy  Margins: Involved by invasive carcinoma (junction of the lateral and  superior; additionally submitted margins are negative (see part G  through J). Final status of lateral margin unknown.  DCIS Margins: Uninvolved by DCIS    Distance from Closest Margin (mm): Greater than 10 mm  Regional Lymph Nodes:    Number of Lymph Nodes Examined: 5    Number of Sentinel Nodes Examined: 5    Number of Lymph Nodes with Macrometastases (>2 mm): 0    Number of Lymph Nodes with Micrometastases: 0    Number of Lymph Nodes with Isolated Tumor Cells (=0.2 mm or =200  cells): 0    Size of Largest Metastatic Deposit (mm): N/A    Extranodal Extension: N/A  Distant Metastasis:    Distant Site(s) Involved: N/A  Breast Biomarker Testing Performed on Previous Biopsy:    Testing Performed on Case Number: [id number]       Estrogen Receptor: POSITIVE, > 95% MODERATE       Progesterone Receptor: POSITIVE, > 95% STRONG  HER2: NEGATIVE (FISH, group 5)        Ki-67: 10%  Pathologic Stage Classification (pTNM, AJCC 8th Edition): pT2, pN0  Representative Tumor Block: A2  Comment(s): Cytokeratin AE1/3 was performed to highlight the presence of  invasive carcinoma at the inked superior/lateral margin.  (v4.5.0.0)   GROSS DESCRIPTION:   A: Specimen type: Left breast seed localization lumpectomy, received  fresh. The specimen is placed in formalin at 2:50 PM on 01/17/2020.  Size: 4.3 cm at the anterior-posterior axis, 8.2 cm at the  lateral-medial axis and 2.7 cm at the superior-inferior axis  Orientation: The specimen is oriented with previously applied inks  (anterior green, inferior blue, lateral orange, medial yellow, posterior  black, superior red).  Localized area: The localization seed is identified at the time of  receipt.  Cut surface: At the localized area there is a 2.1 x 1.6 x 1.2 cm  indurated white nodular mass with ill-defined borders. There is a  ribbon-shaped biopsy clip present in the mass. The remainder the breast  tissue consists of soft yellow adipose tissue and focally dense white  fibrous tissue.  Margins: The mass is located less than 0.1 cm from the superior and  lateral margins. The medial and inferior margins measure 0.7 cm. The  posterior margin measures 0.9 cm. The anterior margin measures greater  than 1 cm.  Prognostic indicators: Obtained from paraffin blocks if needed  Block summary:  5 blocks submitted  1 = tumor with lateral and medial margins  2, 3 = tumor with superior and inferior margins  4 = tumor with posterior margin  5 = anterior margin   B: Received fresh is a 1.1 x 0.7 x 0.6 cm rubbery tan-yellow nodule.  The specimen is bisected and entirely submitted in 1 cassette.   C: Received fresh is a 0.8 x 0.7 x 0.6 cm rubbery tan-yellow nodule.  The specimen is bisected and entirely submitted in 1 cassette.   D: Received fresh is a 0.7 x 0.7 x 0.5 cm rubbery tan-yellow nodule.   The specimen is bisected and entirely submitted in 1 cassette.   E: Received fresh is a 1.4 x 0.6 x 0.6 cm rubbery tan-yellow nodule.  The specimen is bisected and entirely submitted in 1 cassette.   F: Received fresh is a 1.6 x 0.8 x 0.6 cm rubbery tan-yellow nodule.  The specimen is bisected and entirely submitted in 1 cassette.   G: Received fresh is a 2.7 x 2.1 x 1.0 cm portion of fibroadipose  tissue, clinically additional posterior margin left breast. The  specimen is oriented with a short suture at superior, long suture  lateral and double suture deep. The deep (posterior) margin is inked  black prior to sectioning. The cut surfaces consist of soft yellow  adipose tissue and white fibrous tissue. Tumor is not grossly  identified. The specimen is sectioned and entirely submitted in 4  cassettes.   H: Received fresh is a 2.3 x 2.0 x 0.7 cm portion of fibroadipose  tissue, clinically additional inferior margin left breast. The specimen  is  oriented with a short suture at superior, long suture lateral and  double suture at deep. The inferior margin is inked black prior to  sectioning. The cut surfacesconsist of soft yellow adipose tissue and  white fibrous tissue. Tumor is not grossly identified. The specimen is  sectioned and entirely submitted in 3 cassettes.   I: Received fresh is a 2.3 x 1.8 x 0.8 cm portion of fibroadipose  tissue, clinically additional superior margin left breast. The specimen  is oriented with a short suture at superior, long suture at lateral and  double suture deep. The superior margin is inked black prior to  sectioning. The cut surfaces consist of soft yellow adipose tissue and  white fibrous tissue. Tumor is not grossly identified. The specimen is  sectioned and entirely submitted in 4 cassettes.   J: Received fresh is a 3.5 x 2.0 x 0.8 cm portion of fibroadipose  tissue, clinically additional medial margin left breast. The specimen   is oriented with a short suture at superior, long suture at lateral and  double suture at deep. The medial margin is inked black prior to  sectioning. The cut surfaces consist of soft yellow adipose tissue and  white fibrous tissue. Tumor is not grossly identified. The specimen is  sectioned and entirely submitted in 5 cassettes. (GRP 01/18/2020)    Receptor Status: ER(Positive 95% moderate), PR ( Positive 95% Strong), Her2-neu ( Negative Fish group 5 ), Ki-( 10%)    Did patient present with symptoms (if so, please note symptoms) or was this found on screening mammography?:  Past/Anticipated interventions  Surgery if any:  02/06/20 Dr. Rolm Bookbinder   RE-EXCISION OF LEFT BREAST LUMPECTOMY      01/17/2020  Dr. Rolm Bookbinder       LEFT BREAST LUMPECTOMY WITH RADIOACTIVE SEED AND LEFT AXILLARY SENTINEL LYMPH NODE BIOPSY Past/Anticipated interventions by medical oncology, if any: Chemotherapy  Lymphedema issues, if any:  None  Pain issues, if any:  None SAFETY ISSUES:  Prior radiation?  No  Pacemaker/ICD?  No   Possible current pregnancy? No  Is the patient on methotrexate? None  Current Complaints / other details:  Vitals:   03/04/20 1531  BP: 109/74  Pulse: 65  Resp: 18  Temp: 98.1 F (36.7 C)  SpO2: 100%  Weight: 63.2 kg  Height: _0  (1.676 m)       Sherrlyn Hock, RN 02/27/2020,10:07 AM

## 2020-03-04 ENCOUNTER — Ambulatory Visit
Admission: RE | Admit: 2020-03-04 | Discharge: 2020-03-04 | Disposition: A | Discharge status: Home / Self Care | Payer: BC Managed Care – PPO | Source: Ambulatory Visit | Attending: Radiation Oncology | Admitting: Radiation Oncology

## 2020-03-04 ENCOUNTER — Encounter: Payer: Self-pay | Admitting: Radiation Oncology

## 2020-03-04 ENCOUNTER — Other Ambulatory Visit: Payer: Self-pay

## 2020-03-04 VITALS — BP 109/74 | HR 65 | Temp 98.1°F | Resp 18 | Ht 66.0 in | Wt 139.4 lb

## 2020-03-04 DIAGNOSIS — Z87891 Personal history of nicotine dependence: Secondary | ICD-10-CM | POA: Diagnosis not present

## 2020-03-04 DIAGNOSIS — Z90722 Acquired absence of ovaries, bilateral: Secondary | ICD-10-CM | POA: Diagnosis not present

## 2020-03-04 DIAGNOSIS — Z90710 Acquired absence of both cervix and uterus: Secondary | ICD-10-CM | POA: Diagnosis not present

## 2020-03-04 DIAGNOSIS — C50412 Malignant neoplasm of upper-outer quadrant of left female breast: Secondary | ICD-10-CM | POA: Insufficient documentation

## 2020-03-04 DIAGNOSIS — Z803 Family history of malignant neoplasm of breast: Secondary | ICD-10-CM | POA: Insufficient documentation

## 2020-03-04 DIAGNOSIS — Z170 Estrogen receptor positive status [ER+]: Secondary | ICD-10-CM | POA: Insufficient documentation

## 2020-03-04 NOTE — Progress Notes (Signed)
Radiation Oncology         [phone] ________________________________  Initial Outpatient Consultation  Name: Shelly [name] MRN: [id number]  Date: 03/04/2020  DOB: 1970-07-29  OE:UMPNT, Beryle Quant, MD  Jerelyn Charles, MD   REFERRING PHYSICIAN: Jerelyn Charles, MD  DIAGNOSIS: The encounter diagnosis was Malignant neoplasm of upper-outer quadrant of left breast in female, estrogen receptor positive ([place]).  Stage IA (pT2, pN0) Left Breast UOQ, Invasive Ductal Carcinoma with high-grade DCIS, ER+ / PR+ / Her2-, Grade 2  HISTORY OF PRESENT ILLNESS::Shelly [name] is a 50 y.o. female who is seen as a courtesy of Dr. Lindi Adie for an opinion concerning radiation therapy as part of management for her recently diagnosed breast cancer. Today, she is accompanied by her spouse Dr. Bobbye Charleston. She had routine screening mammography that showed a possible abnormality in the left breast. She then underwent unilateral diagnostic mammography and left breast ultrasonography on 12/23/2019 that showed a highly suspicious mass in the left breast at the 1 o'clock position. By ultrasound, it measured 1.3 cm but appeared closer to 2 cm mammographically. Additionally, there was one indeterminate left axillary lymph node with cortex that measured up to 4 mm.  Biopsy on 12/31/2019 showed: grade 2 invasive mammary carcinoma with mammary carcinoma in situ. E-cadherin was performed and the tumor was diffusely and strongly positive, consistent with ductal phenotype. Prognostic indicators significant for: estrogen receptor, >95% positive with a moderate staining intensity and progesterone receptor, >95% positive with a strong staining intensity. Proliferation marker Ki67 at 10%. HER2 negative. A left axillary lymph node was also biopsied and was negative for malignancy.  Subsequently, the patient was seen by Dr. Lindi Adie, who recommended that she proceed with breast conserving surgery, Oncotype DX to testing to see  if chemotherapy would be of any benefit, adjuvant radiation therapy, and adjuvant antiestrogen therapy.  The patient underwent a left breast lumpectomy with left deep axillary sentinel lymph node biopsy on 01/17/2020 under the care of Dr. Donne Hazel. Pathology from the procedure revealed grade 2 invasive ductal carcinoma with high-grade DCIS. The carcinoma extended to the junction of the superior and lateral margins. Left additional posterior, inferior, superior, and medial margins were excised, all of which were negative for residual carcinoma. Five left axillary sentinel lymph nodes were biopsied, all of which were negative for carcinoma.  The patient underwent a re-excision of the left breast lateral margin on 02/06/2020 under the care of Dr. Donne Hazel. Pathology from the procedure did not show any residual carcinoma.  Oncotype DX was obtained on the final surgical sample and the recurrence score of 15 predicts a risk of recurrence outside the breast over the next 9 years of 4%, if the patient's only systemic therapy was an antiestrogen for 5 years. It also predicted no significant benefit from chemotherapy.  PREVIOUS RADIATION THERAPY: No  PAST MEDICAL HISTORY:  Past Medical History:  Diagnosis Date  . Cancer ([place]) 01/2020   left breast IDC with DCIS  . 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 No pertinent past medical history   . PONV (postoperative nausea and vomiting)     PAST SURGICAL HISTORY: Past Surgical History:  Procedure Laterality Date  . ABDOMINAL HYSTERECTOMY    . BREAST LUMPECTOMY WITH RADIOACTIVE SEED AND SENTINEL LYMPH NODE BIOPSY Left 01/17/2020   Procedure: LEFT BREAST LUMPECTOMY WITH RADIOACTIVE SEED AND LEFT AXILLARY SENTINEL LYMPH NODE BIOPSY;  Surgeon: Rolm Bookbinder, MD;  Location: [facility];  Service: General;  Laterality: Left;  PECTORAL BLOCK  . CYSTOSCOPY  01/02/2012   Procedure: CYSTOSCOPY;  Surgeon: Peri Maris, MD;  Location: [facility];  Service: Gynecology;  Laterality: N/A;  . DILATION AND CURETTAGE OF UTERUS    . HYSTEROSCOPY    . OPEN REDUCTION INTERNAL FIXATION (ORIF) DISTAL RADIAL FRACTURE Left 03/27/2016   Procedure: OPEN REDUCTION INTERNAL FIXATION (ORIF) DISTAL RADIAL FRACTURE;  Surgeon: Iran Planas, MD;  Location: [facility];  Service: Orthopedics;  Laterality: Left;  . RE-EXCISION OF BREAST LUMPECTOMY Left 02/06/2020   Procedure: RE-EXCISION OF LEFT BREAST LUMPECTOMY;  Surgeon: Rolm Bookbinder, MD;  Location: [facility];  Service: General;  Laterality: Left;  . ROBOTIC ASSISTED TOTAL HYSTERECTOMY  01/02/2012   Procedure: ROBOTIC ASSISTED TOTAL HYSTERECTOMY;  Surgeon: Peri Maris, MD;  Location: [facility];  Service: Gynecology;  Laterality: N/A;  Roboitc Bilateral Salpingectomy   . TONSILLECTOMY    . uterine ablation      FAMILY HISTORY:  Family History  Problem Relation Age of Onset  . Thyroid disease Mother   . Breast cancer Mother 69       breast cancer    SOCIAL HISTORY:  Social History   Tobacco Use  . Smoking status: Former Smoker    Quit date: 02/01/1996    Years since quitting: 24.1  . Smokeless tobacco: Never Used  Vaping Use  . Vaping Use: Never used  Substance Use Topics  . Alcohol use: Not Currently  . Drug use: No    ALLERGIES: No Known Allergies  MEDICATIONS:  Current Outpatient Medications  Medication Sig Dispense Refill  . azithromycin (ZITHROMAX) 250 MG tablet Take 250 mg by mouth as directed.    Marland Kitchen guaiFENesin (MUCINEX) 600 MG 12 hr tablet Take by mouth 2 (two) times daily.    Marland Kitchen ibuprofen (ADVIL,MOTRIN) 200 MG tablet Take 200 mg by mouth every 6 (six) hours as needed.     No current facility-administered medications for this encounter.    REVIEW OF SYSTEMS:  A 10+ POINT REVIEW OF SYSTEMS WAS OBTAINED including neurology, dermatology, psychiatry, cardiac, respiratory, lymph, extremities, GI, GU, musculoskeletal, constitutional, reproductive, HEENT.  She reports  some discomfort within the left breast area but no actual pain.  Prior to diagnosis patient denied any nipple discharge or bleeding or pain within the breast.  She denies any problems with swelling in her left arm or hand she notices some mild numbness in the axillary region   PHYSICAL EXAM:  height is $RemoveB'5\' 6"'vtfcxfpY$  (1.676 m) and weight is 139 lb 6.4 oz (63.2 kg). Her temperature is 98.1 F (36.7 C). Her blood pressure is 109/74 and her pulse is 65. Her respiration is 18 and oxygen saturation is 100%.   General: Alert and oriented, in no acute distress HEENT: Head is normocephalic. Extraocular movements are intact.  Neck: Neck is supple, no palpable cervical or supraclavicular lymphadenopathy. Heart: Regular in rate and rhythm with no murmurs, rubs, or gallops. Chest: Clear to auscultation bilaterally, with no rhonchi, wheezes, or rales. Abdomen: Soft, nontender, nondistended, with no rigidity or guarding. Extremities: No cyanosis or edema. Lymphatics: see Neck Exam Skin: No concerning lesions. Musculoskeletal: symmetric strength and muscle tone throughout. Neurologic: Cranial nerves II through XII are grossly intact. No obvious focalities. Speech is fluent. Coordination is intact. Psychiatric: Judgment and insight are intact. Affect is appropriate. Right breast: No palpable mass, nipple discharge, or bleeding. Left breast: Well-healing periareolar scar in the upper outer quadrant.  Mild bruising noted in the lateral aspect of  the breast area and some mild swelling. no dominant mass appreciated in the breast, nipple discharge or bleeding.  Excellent cosmetic result particularly in light of the fact that she has had 2 surgeries.  Sentinel node scar healing well without signs of drainage or infection.  ECOG = 0  0 - Asymptomatic (Fully active, able to carry on all predisease activities without restriction)  1 - Symptomatic but completely ambulatory (Restricted in physically strenuous activity but  ambulatory and able to carry out work of a light or sedentary nature. For example, light housework, office work)  2 - Symptomatic, <50% in bed during the day (Ambulatory and capable of all self care but unable to carry out any work activities. Up and about more than 50% of waking hours)  3 - Symptomatic, >50% in bed, but not bedbound (Capable of only limited self-care, confined to bed or chair 50% or more of waking hours)  4 - Bedbound (Completely disabled. Cannot carry on any self-care. Totally confined to bed or chair)  5 - Death   Eustace Pen MM, Creech RH, Tormey DC, et al. [phone]). "Toxicity and response criteria of the [facility]". [place] Oncol. 5 (6): 649-55  LABORATORY DATA:  Lab Results  Component Value Date   WBC 5.7 12/28/2011   HGB 13.0 09/03/2012   HCT 41.0 05/21/2012   MCV 89.1 12/28/2011   PLT 203 12/28/2011   Lab Results  Component Value Date   NA 143 05/21/2012   K 3.8 05/21/2012   CL 102 05/21/2012   CO2 28 01/02/2012   GLUCOSE 83 05/21/2012   CREATININE 0.80 05/21/2012   CALCIUM 8.9 01/02/2012      RADIOGRAPHY: No results found.    IMPRESSION: Stage IA (pT2, pN0) Left Breast UOQ, Invasive Ductal Carcinoma with high-grade DCIS, ER+ / PR+ / Her2-, Grade 2  The patient would be an excellent candidate for breast conservation with radiation therapy as a component.  In light of the negative reexcision I do not feel she would require a boost to the lumpectomy cavity.  In addition the patient will be an excellent candidate for hypofractionated accelerated radiation therapy over approximately 3 weeks.  Today, I talked to the patient and her spouse about the findings and work-up thus far.  We discussed the natural history of breast cancer and general treatment, highlighting the role of radiotherapy in the management.  We discussed the available radiation techniques, and focused on the details of logistics and delivery.  We reviewed the anticipated  acute and late sequelae associated with radiation in this setting.  The patient was encouraged to ask questions that I answered to the best of my ability.  A patient consent form was discussed and signed.  We retained a copy for our records.  The patient would like to proceed with radiation and will be scheduled for CT simulation.  PLAN: Patient will return early next week for CT simulation with treatments to begin approximately a week and a half from now.  Anticipate 16 treatments directed at the left breast as hypofractionated accelerated treatment.  Will use cardiac sparing techniques if necessary.  Total time spent in this encounter was 45 minutes which included reviewing the patient's most recent mammogram, ultrasound, biopsies, pathology reports, consultations, follow-ups, oncotype, physical examination, and documentation.   ------------------------------------------------  Blair Promise, PhD, MD  This document serves as a record of services personally performed by Gery Pray, MD. It was created on his behalf by Clerance Lav, a  trained medical scribe. The creation of this record is based on the scribe's personal observations and the provider's statements to them. This document has been checked and approved by the attending provider.

## 2020-03-06 ENCOUNTER — Encounter: Payer: Self-pay | Admitting: *Deleted

## 2020-03-09 ENCOUNTER — Ambulatory Visit
Admission: RE | Admit: 2020-03-09 | Discharge: 2020-03-09 | Disposition: A | Discharge status: Home / Self Care | Payer: BC Managed Care – PPO | Source: Ambulatory Visit | Attending: Radiation Oncology | Admitting: Radiation Oncology

## 2020-03-09 ENCOUNTER — Other Ambulatory Visit: Payer: Self-pay

## 2020-03-09 DIAGNOSIS — C50412 Malignant neoplasm of upper-outer quadrant of left female breast: Secondary | ICD-10-CM | POA: Diagnosis present

## 2020-03-09 DIAGNOSIS — Z510 Encounter for antineoplastic radiation therapy: Secondary | ICD-10-CM | POA: Diagnosis not present

## 2020-03-09 DIAGNOSIS — Z170 Estrogen receptor positive status [ER+]: Secondary | ICD-10-CM

## 2020-03-16 ENCOUNTER — Encounter: Payer: Self-pay | Admitting: *Deleted

## 2020-03-16 ENCOUNTER — Other Ambulatory Visit: Payer: Self-pay | Admitting: *Deleted

## 2020-03-16 ENCOUNTER — Telehealth: Payer: Self-pay | Admitting: Hematology and Oncology

## 2020-03-16 NOTE — Telephone Encounter (Signed)
Scheduled appt per 2/14 sch msg - mailed letter with appt date and time

## 2020-03-17 ENCOUNTER — Ambulatory Visit
Admission: RE | Admit: 2020-03-17 | Discharge: 2020-03-17 | Disposition: A | Discharge status: Home / Self Care | Payer: BC Managed Care – PPO | Source: Ambulatory Visit | Attending: Radiation Oncology | Admitting: Radiation Oncology

## 2020-03-17 ENCOUNTER — Other Ambulatory Visit: Payer: Self-pay

## 2020-03-17 DIAGNOSIS — Z510 Encounter for antineoplastic radiation therapy: Secondary | ICD-10-CM | POA: Diagnosis not present

## 2020-03-17 DIAGNOSIS — Z170 Estrogen receptor positive status [ER+]: Secondary | ICD-10-CM

## 2020-03-17 MED ORDER — RADIAPLEXRX EX GEL: Freq: Once | CUTANEOUS | Status: AC

## 2020-03-17 MED ORDER — ALRA NON-METALLIC DEODORANT (RAD-ONC)
1.0000 "application " | Freq: Once | TOPICAL | Status: AC
  Administered 2020-03-17: 1 via TOPICAL

## 2020-03-18 ENCOUNTER — Ambulatory Visit
Admission: RE | Admit: 2020-03-18 | Discharge: 2020-03-18 | Disposition: A | Discharge status: Home / Self Care | Payer: BC Managed Care – PPO | Source: Ambulatory Visit | Attending: Radiation Oncology | Admitting: Radiation Oncology

## 2020-03-18 DIAGNOSIS — Z510 Encounter for antineoplastic radiation therapy: Secondary | ICD-10-CM | POA: Diagnosis not present

## 2020-03-19 ENCOUNTER — Other Ambulatory Visit: Payer: Self-pay

## 2020-03-19 ENCOUNTER — Ambulatory Visit
Admission: RE | Admit: 2020-03-19 | Discharge: 2020-03-19 | Disposition: A | Discharge status: Home / Self Care | Payer: BC Managed Care – PPO | Source: Ambulatory Visit | Attending: Radiation Oncology | Admitting: Radiation Oncology

## 2020-03-19 DIAGNOSIS — Z510 Encounter for antineoplastic radiation therapy: Secondary | ICD-10-CM | POA: Diagnosis not present

## 2020-03-20 ENCOUNTER — Ambulatory Visit
Admission: RE | Admit: 2020-03-20 | Discharge: 2020-03-20 | Disposition: A | Discharge status: Home / Self Care | Payer: BC Managed Care – PPO | Source: Ambulatory Visit | Attending: Radiation Oncology | Admitting: Radiation Oncology

## 2020-03-20 DIAGNOSIS — Z510 Encounter for antineoplastic radiation therapy: Secondary | ICD-10-CM | POA: Diagnosis not present

## 2020-03-23 ENCOUNTER — Ambulatory Visit
Admission: RE | Admit: 2020-03-23 | Discharge: 2020-03-23 | Disposition: A | Discharge status: Home / Self Care | Payer: BC Managed Care – PPO | Source: Ambulatory Visit | Attending: Radiation Oncology | Admitting: Radiation Oncology

## 2020-03-23 ENCOUNTER — Other Ambulatory Visit: Payer: Self-pay

## 2020-03-23 DIAGNOSIS — Z510 Encounter for antineoplastic radiation therapy: Secondary | ICD-10-CM | POA: Diagnosis not present

## 2020-03-24 ENCOUNTER — Ambulatory Visit
Admission: RE | Admit: 2020-03-24 | Discharge: 2020-03-24 | Disposition: A | Discharge status: Home / Self Care | Payer: BC Managed Care – PPO | Source: Ambulatory Visit | Attending: Radiation Oncology | Admitting: Radiation Oncology

## 2020-03-24 ENCOUNTER — Other Ambulatory Visit: Payer: Self-pay

## 2020-03-24 DIAGNOSIS — Z510 Encounter for antineoplastic radiation therapy: Secondary | ICD-10-CM | POA: Diagnosis not present

## 2020-03-25 ENCOUNTER — Ambulatory Visit
Admission: RE | Admit: 2020-03-25 | Discharge: 2020-03-25 | Disposition: A | Discharge status: Home / Self Care | Payer: BC Managed Care – PPO | Source: Ambulatory Visit | Attending: Radiation Oncology | Admitting: Radiation Oncology

## 2020-03-25 DIAGNOSIS — Z510 Encounter for antineoplastic radiation therapy: Secondary | ICD-10-CM | POA: Diagnosis not present

## 2020-03-26 ENCOUNTER — Other Ambulatory Visit: Payer: Self-pay

## 2020-03-26 ENCOUNTER — Ambulatory Visit
Admission: RE | Admit: 2020-03-26 | Discharge: 2020-03-26 | Disposition: A | Discharge status: Home / Self Care | Payer: BC Managed Care – PPO | Source: Ambulatory Visit | Attending: Radiation Oncology | Admitting: Radiation Oncology

## 2020-03-26 DIAGNOSIS — Z510 Encounter for antineoplastic radiation therapy: Secondary | ICD-10-CM | POA: Diagnosis not present

## 2020-03-27 ENCOUNTER — Other Ambulatory Visit: Payer: Self-pay

## 2020-03-27 ENCOUNTER — Ambulatory Visit
Admission: RE | Admit: 2020-03-27 | Discharge: 2020-03-27 | Disposition: A | Discharge status: Home / Self Care | Payer: BC Managed Care – PPO | Source: Ambulatory Visit | Attending: Radiation Oncology | Admitting: Radiation Oncology

## 2020-03-27 DIAGNOSIS — Z510 Encounter for antineoplastic radiation therapy: Secondary | ICD-10-CM | POA: Diagnosis not present

## 2020-03-30 ENCOUNTER — Ambulatory Visit
Admission: RE | Admit: 2020-03-30 | Discharge: 2020-03-30 | Disposition: A | Discharge status: Home / Self Care | Payer: BC Managed Care – PPO | Source: Ambulatory Visit | Attending: Radiation Oncology | Admitting: Radiation Oncology

## 2020-03-30 DIAGNOSIS — Z510 Encounter for antineoplastic radiation therapy: Secondary | ICD-10-CM | POA: Diagnosis not present

## 2020-03-31 ENCOUNTER — Ambulatory Visit
Admission: RE | Admit: 2020-03-31 | Discharge: 2020-03-31 | Disposition: A | Discharge status: Home / Self Care | Payer: BC Managed Care – PPO | Source: Ambulatory Visit | Attending: Radiation Oncology | Admitting: Radiation Oncology

## 2020-03-31 ENCOUNTER — Other Ambulatory Visit: Payer: Self-pay

## 2020-03-31 DIAGNOSIS — Z510 Encounter for antineoplastic radiation therapy: Secondary | ICD-10-CM | POA: Diagnosis not present

## 2020-03-31 DIAGNOSIS — C50412 Malignant neoplasm of upper-outer quadrant of left female breast: Secondary | ICD-10-CM | POA: Diagnosis present

## 2020-04-01 ENCOUNTER — Ambulatory Visit
Admission: RE | Admit: 2020-04-01 | Discharge: 2020-04-01 | Disposition: A | Discharge status: Home / Self Care | Payer: BC Managed Care – PPO | Source: Ambulatory Visit | Attending: Radiation Oncology | Admitting: Radiation Oncology

## 2020-04-01 DIAGNOSIS — Z510 Encounter for antineoplastic radiation therapy: Secondary | ICD-10-CM | POA: Diagnosis not present

## 2020-04-02 ENCOUNTER — Ambulatory Visit
Admission: RE | Admit: 2020-04-02 | Discharge: 2020-04-02 | Disposition: A | Discharge status: Home / Self Care | Payer: BC Managed Care – PPO | Source: Ambulatory Visit | Attending: Radiation Oncology | Admitting: Radiation Oncology

## 2020-04-02 DIAGNOSIS — Z510 Encounter for antineoplastic radiation therapy: Secondary | ICD-10-CM | POA: Diagnosis not present

## 2020-04-03 ENCOUNTER — Ambulatory Visit
Admission: RE | Admit: 2020-04-03 | Discharge: 2020-04-03 | Disposition: A | Discharge status: Home / Self Care | Payer: BC Managed Care – PPO | Source: Ambulatory Visit | Attending: Radiation Oncology | Admitting: Radiation Oncology

## 2020-04-03 ENCOUNTER — Other Ambulatory Visit: Payer: Self-pay

## 2020-04-03 DIAGNOSIS — Z510 Encounter for antineoplastic radiation therapy: Secondary | ICD-10-CM | POA: Diagnosis not present

## 2020-04-06 ENCOUNTER — Encounter: Payer: Self-pay | Admitting: *Deleted

## 2020-04-06 ENCOUNTER — Ambulatory Visit
Admission: RE | Admit: 2020-04-06 | Discharge: 2020-04-06 | Disposition: A | Discharge status: Home / Self Care | Payer: BC Managed Care – PPO | Source: Ambulatory Visit | Attending: Radiation Oncology | Admitting: Radiation Oncology

## 2020-04-06 DIAGNOSIS — Z510 Encounter for antineoplastic radiation therapy: Secondary | ICD-10-CM | POA: Diagnosis not present

## 2020-04-06 NOTE — Progress Notes (Signed)
Patient Care Team: Jerelyn Charles, MD as PCP - General (Obstetrics) Mauro Kaufmann, RN as Oncology Nurse Navigator Rockwell Germany, RN as Oncology Nurse Navigator  DIAGNOSIS:    ICD-10-CM   1. Malignant neoplasm of upper-outer quadrant of left breast in female, estrogen receptor positive ([place])  C50.412    Z17.0     SUMMARY OF ONCOLOGIC HISTORY: Oncology History  Malignant neoplasm of upper-outer quadrant of left breast in female, estrogen receptor positive ([place])  12/31/2019 Initial Diagnosis   Screening detected left breast mass, 1 o'clock position: 1.3 cm by ultrasound (about 2 cm by mammogram), 1 indeterminate left axillary lymph node. 12/31/2019: Biopsy revealed grade 2 IDC with DCIS, lymph node benign, ER greater than 95%, PR greater than 95%, Ki-67 10%, HER-2 negative ratio 1.18, copy number: 2   01/10/2020 Cancer Staging   Staging form: Breast, AJCC 8th Edition - Clinical: Stage IA (cT1c, cN0, cM0, G2, ER+, PR+, HER2-) - Signed by Nicholas Lose, MD on 01/10/2020   01/17/2020 Surgery   Left lumpectomy Donne Hazel): IDC, grade 2, 2.1cm, with high grade DCIS, carcinoma extending to the lateral and superior margin juncture, 5 right axillary lymph nodes negative for carcinoma.    02/06/2020 Surgery   Re-excision of the lateral margin: no residual carcinoma.    02/2020 Oncotype testing   Oncotype results: 15/4%.    03/18/2020 -  Radiation Therapy   Adjuvant radiation treatment.      CHIEF COMPLIANT: Follow-up to discuss antiestrogen therapy  INTERVAL HISTORY: Shelly [name] is a 50 y.o. with above-mentioned history of left breast cancer. 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 She underwent a re-excision of the lateral margin on 02/06/20 for which pathology  showed no residual carcinoma. Oncotype results showed a score of 15/4%. She is currently on radiation treatment. She presents to the clinic today to discuss antiestrogen therapy.   ALLERGIES:  has No Known Allergies.  MEDICATIONS:  Current Outpatient Medications  Medication Sig Dispense Refill  . azithromycin (ZITHROMAX) 250 MG tablet Take 250 mg by mouth as directed.    Marland Kitchen guaiFENesin (MUCINEX) 600 MG 12 hr tablet Take by mouth 2 (two) times daily.    Marland Kitchen ibuprofen (ADVIL,MOTRIN) 200 MG tablet Take 200 mg by mouth every 6 (six) hours as needed.     No current facility-administered medications for this visit.    PHYSICAL EXAMINATION: ECOG PERFORMANCE STATUS: 1 - Symptomatic but completely ambulatory  There were no vitals filed for this visit. There were no vitals filed for this visit.   LABORATORY DATA:  I have reviewed the data as listed CMP Latest Ref Rng & Units 05/21/2012 01/02/2012  Glucose 70 - 99 mg/dL 83 93  BUN 6 - 23 mg/dL 14 10  Creatinine 0.50 - 1.10 mg/dL 0.80 0.66  Sodium 135 - 145 mEq/L 143 138  Potassium 3.5 - 5.1 mEq/L 3.8 3.6  Chloride 96 - 112 mEq/L 102 103  CO2 19 - 32 mEq/L - 28  Calcium 8.4 - 10.5 mg/dL - 8.9  Total Protein 6.0 - 8.3 g/dL - 7.0  Total Bilirubin 0.3 - 1.2 mg/dL - 0.3  Alkaline Phos 39 - 117 U/L - 46  AST 0 - 37 U/L - 17  ALT 0 - 35 U/L - 9    Lab Results  Component Value  Date   WBC 5.7 12/28/2011   HGB 13.0 09/03/2012   HCT 41.0 05/21/2012   MCV 89.1 12/28/2011   PLT 203 12/28/2011    ASSESSMENT & PLAN:  Malignant neoplasm of upper-outer quadrant of left breast in female, estrogen receptor positive ([place]) Screening detected left breast mass, 1 o'clock position: 1.3 cm by ultrasound (about 2 cm by mammogram), 1 indeterminate left axillary lymph node. 12/31/2019: Biopsy revealed grade 2 IDC with DCIS, lymph node benign, ER greater than 95%, PR greater than 95%, Ki-67 10%, HER-2 negative ratio 1.18, copy number: 2  Recommendations: 1.  01/17/20: Left lumpectomy Donne Hazel): IDC, grade 2, 2.1cm, with high grade DCIS, carcinoma extending to the lateral and superior margin juncture, 5 right axillary lymph nodes negative for carcinoma.  2. Oncotype DX: 15, ROR 4% 3. Adjuvant radiation therapy started 03/18/20 4. Adjuvant antiestrogen therapy ------------------------------------------------------------------------------------------------------------- Treatment Plan: Anastrozole 1 mg daily to start 05/01/2020 (patient is postmenopausal)  Anastrozole counseling: We discussed the risks and benefits of anti-estrogen therapy with aromatase inhibitors. These include but not limited to insomnia, hot flashes, mood changes, vaginal dryness, bone density loss, and weight gain. We strongly believe that the benefits far outweigh the risks. Patient understands these risks and consented to starting treatment. Planned treatment duration is 7 years.  Return to clinic in 3 months for survivorship care plan visit  No orders of the defined types were placed in this encounter.  The patient has a good understanding of the overall plan. she agrees with it. she will call with any problems that may develop before the next visit here.  Total time spent: 30 mins including face to face time and time spent for planning, charting and coordination of care  Rulon Eisenmenger, MD, MPH 04/07/2020  I, Molly Dorshimer, am acting as scribe for Dr. Nicholas Lose.  I have reviewed the above documentation for accuracy and completeness, and I agree with the above.

## 2020-04-06 NOTE — Assessment & Plan Note (Signed)
Screening detected left breast mass, 1 o'clock position: 1.3 cm by ultrasound (about 2 cm by mammogram), 1 indeterminate left axillary lymph node. 12/31/2019: Biopsy revealed grade 2 IDC with DCIS, lymph node benign, ER greater than 95%, PR greater than 95%, Ki-67 10%, HER-2 negative ratio 1.18, copy number: 2  Recommendations: 1. 01/17/20: Left lumpectomy Shelly [name]): IDC, grade 2, 2.1cm, with high grade DCIS, carcinoma extending to the lateral and superior margin juncture, 5 right axillary lymph nodes negative for carcinoma.  2. Oncotype DX: 15, ROR 4% 3. Adjuvant radiation therapy started 03/18/20 4. Adjuvant antiestrogen therapy ------------------------------------------------------------------------------------------------------------- Treatment Plan:

## 2020-04-07 ENCOUNTER — Encounter: Payer: Self-pay | Admitting: Radiation Oncology

## 2020-04-07 ENCOUNTER — Telehealth: Payer: Self-pay | Admitting: Adult Health

## 2020-04-07 ENCOUNTER — Inpatient Hospital Stay: Payer: BC Managed Care – PPO | Attending: Hematology and Oncology | Admitting: Hematology and Oncology

## 2020-04-07 ENCOUNTER — Other Ambulatory Visit: Payer: Self-pay

## 2020-04-07 ENCOUNTER — Ambulatory Visit
Admission: RE | Admit: 2020-04-07 | Discharge: 2020-04-07 | Disposition: A | Discharge status: Home / Self Care | Payer: BC Managed Care – PPO | Source: Ambulatory Visit | Attending: Radiation Oncology | Admitting: Radiation Oncology

## 2020-04-07 DIAGNOSIS — Z170 Estrogen receptor positive status [ER+]: Secondary | ICD-10-CM | POA: Insufficient documentation

## 2020-04-07 DIAGNOSIS — Z510 Encounter for antineoplastic radiation therapy: Secondary | ICD-10-CM | POA: Diagnosis not present

## 2020-04-07 DIAGNOSIS — Z79811 Long term (current) use of aromatase inhibitors: Secondary | ICD-10-CM | POA: Insufficient documentation

## 2020-04-07 DIAGNOSIS — C50412 Malignant neoplasm of upper-outer quadrant of left female breast: Secondary | ICD-10-CM | POA: Insufficient documentation

## 2020-04-07 MED ORDER — ANASTROZOLE 1 MG PO TABS: 1.0000 mg | ORAL_TABLET | Freq: Every day | ORAL | 3 refills | Status: DC

## 2020-04-07 NOTE — Telephone Encounter (Signed)
Scheduled appts per 3/8 los. Pt stated she would refer to mychart for appts.

## 2020-04-23 ENCOUNTER — Encounter: Payer: Self-pay | Admitting: Hematology and Oncology

## 2020-04-29 NOTE — Progress Notes (Signed)
Patient Care Team: Jerelyn Charles, MD as PCP - General (Obstetrics) Mauro Kaufmann, RN as Oncology Nurse Navigator Rockwell Germany, RN as Oncology Nurse Navigator  DIAGNOSIS:    ICD-10-CM   1. Malignant neoplasm of upper-outer quadrant of left breast in female, estrogen receptor positive ([place])  C50.412    Z17.0     SUMMARY OF ONCOLOGIC HISTORY: Oncology History  Malignant neoplasm of upper-outer quadrant of left breast in female, estrogen receptor positive ([place])  12/31/2019 Initial Diagnosis   Screening detected left breast mass, 1 o'clock position: 1.3 cm by ultrasound (about 2 cm by mammogram), 1 indeterminate left axillary lymph node. 12/31/2019: Biopsy revealed grade 2 IDC with DCIS, lymph node benign, ER greater than 95%, PR greater than 95%, Ki-67 10%, HER-2 negative ratio 1.18, copy number: 2   01/10/2020 Cancer Staging   Staging form: Breast, AJCC 8th Edition - Clinical: Stage IA (cT1c, cN0, cM0, G2, ER+, PR+, HER2-) - Signed by Nicholas Lose, MD on 01/10/2020   01/17/2020 Surgery   Left lumpectomy Donne Hazel): IDC, grade 2, 2.1cm, with high grade DCIS, carcinoma extending to the lateral and superior margin juncture, 5 right axillary lymph nodes negative for carcinoma.    02/06/2020 Surgery   Re-excision of the lateral margin: no residual carcinoma.    02/2020 Oncotype testing   Oncotype results: 15/4%.    03/18/2020 - 04/07/2020 Radiation Therapy   Adjuvant radiation treatment.    05/2020 -  Anti-estrogen oral therapy        CHIEF COMPLIANT: Follow-up of left breast cancer on anastrozole   INTERVAL HISTORY: Shelly [name] is a 50 y.o. with above-mentioned history of leftbreast cancer who underwent a left lumpectomy followed by re-excision, radiation. She presents to the clinic today to discuss antiestrogen therapy.    ALLERGIES:  has No Known Allergies.  MEDICATIONS:  Current Outpatient Medications  Medication Sig Dispense Refill  . [START ON 05/01/2020]  anastrozole (ARIMIDEX) 1 MG tablet Take 1 tablet (1 mg total) by mouth daily. 90 tablet 3  . azithromycin (ZITHROMAX) 250 MG tablet Take 250 mg by mouth as directed.    Marland Kitchen guaiFENesin (MUCINEX) 600 MG 12 hr tablet Take by mouth 2 (two) times daily.    Marland Kitchen ibuprofen (ADVIL,MOTRIN) 200 MG tablet Take 200 mg by mouth every 6 (six) hours as needed.     No current facility-administered medications for this visit.    PHYSICAL EXAMINATION: ECOG PERFORMANCE STATUS: 0 - Asymptomatic  Vitals:   04/30/20 0929  BP: 107/66  Pulse: 66  Resp: 20  Temp: 98.1 F (36.7 C)  SpO2: 100%   Filed Weights   04/30/20 0929  Weight: 139 lb 11.2 oz (63.4 kg)     LABORATORY DATA:  I have reviewed the data as listed CMP Latest Ref Rng & Units 05/21/2012 01/02/2012  Glucose 70 - 99 mg/dL 83 93  BUN 6 - 23 mg/dL 14 10  Creatinine 0.50 - 1.10 mg/dL 0.80 0.66  Sodium 135 - 145 mEq/L 143 138  Potassium 3.5 - 5.1 mEq/L 3.8 3.6  Chloride 96 - 112 mEq/L 102 103  CO2 19 - 32 mEq/L - 28  Calcium 8.4 - 10.5 mg/dL - 8.9  Total Protein 6.0 - 8.3 g/dL - 7.0  Total Bilirubin 0.3 - 1.2 mg/dL - 0.3  Alkaline Phos 39 - 117 U/L - 46  AST 0 - 37 U/L - 17  ALT 0 - 35 U/L - 9    Lab Results  Component Value Date  WBC 5.7 12/28/2011   HGB 13.0 09/03/2012   HCT 41.0 05/21/2012   MCV 89.1 12/28/2011   PLT 203 12/28/2011    ASSESSMENT & PLAN:  Malignant neoplasm of upper-outer quadrant of left breast in female, estrogen receptor positive ([place]) Screening detected left breast mass, 1 o'clock position: 1.3 cm by ultrasound (about 2 cm by mammogram), 1 indeterminate left axillary lymph node. 12/31/2019: Biopsy revealed grade 2 IDC with DCIS, lymph node benign, ER greater than 95%, PR greater than 95%, Ki-67 10%, HER-2 negative ratio 1.18, copy number: 2  Recommendations: 1. 01/17/20: Left lumpectomy Donne Hazel): IDC, grade 2, 2.1cm, with high grade DCIS, carcinoma extending to the lateral and superior margin juncture, 5  right axillary lymph nodes negative for carcinoma.  2. Oncotype DX: 15, ROR 4% 3. Adjuvant radiation therapy started 03/18/20 4. Adjuvant antiestrogen therapy ------------------------------------------------------------------------------------------------------------- Treatment Plan:  Tamoxifen 20 mg daily to start 05/01/2020   Reason for his changing from anastrozole to tamoxifen was that she had [place] level of 24 on 04/22/2020 and surprisingly it went up to 39 on 04/29/2020.  (She had a hysterectomy previously)   Given the fact that she had an estradiol level of 24 we felt that it would not be safe to give anastrozole. My recommendation is to give her tamoxifen and we can recheck her [facility] in 6 months and then determine if we can switch her to anastrozole at that time the other possibility for the difference in [facility] is lab error.   Tamoxifen counseling: We discussed the risks and benefits of tamoxifen. These include but not limited to insomnia, hot flashes, mood changes, vaginal dryness, and weight gain. Although rare, serious side effects including   risk of blood clots were also discussed. We strongly believe that the benefits far outweigh the risks. Patient understands these risks and consented to starting treatment.   She will do labs at her gynecologist office in 6 months. She has an appointment in June for survivorship care plan visit. I will see her back in 6 months after the labs to discuss any changes to treatment plan.  No orders of the defined types were placed in this encounter.  The patient has a good understanding of the overall plan. she agrees with it. she will call with any problems that may develop before the next visit here.  Total time spent: 20 mins including face to face time and time spent for planning, charting and coordination of care  Rulon Eisenmenger, MD, MPH 04/30/2020  I, Molly Dorshimer, am acting as scribe for Dr. Nicholas Lose.  I have reviewed the above  documentation for accuracy and completeness, and I agree with the above.

## 2020-04-30 ENCOUNTER — Inpatient Hospital Stay (HOSPITAL_BASED_OUTPATIENT_CLINIC_OR_DEPARTMENT_OTHER): Payer: BC Managed Care – PPO | Admitting: Hematology and Oncology

## 2020-04-30 ENCOUNTER — Other Ambulatory Visit: Payer: Self-pay

## 2020-04-30 DIAGNOSIS — Z170 Estrogen receptor positive status [ER+]: Secondary | ICD-10-CM | POA: Diagnosis not present

## 2020-04-30 DIAGNOSIS — C50412 Malignant neoplasm of upper-outer quadrant of left female breast: Secondary | ICD-10-CM

## 2020-04-30 MED ORDER — TAMOXIFEN CITRATE 20 MG PO TABS: 20.0000 mg | ORAL_TABLET | Freq: Every day | ORAL | 1 refills | Status: DC

## 2020-04-30 NOTE — Assessment & Plan Note (Signed)
Screening detected left breast mass, 1 o'clock position: 1.3 cm by ultrasound (about 2 cm by mammogram), 1 indeterminate left axillary lymph node. 12/31/2019: Biopsy revealed grade 2 IDC with DCIS, lymph node benign, ER greater than 95%, PR greater than 95%, Ki-67 10%, HER-2 negative ratio 1.18, copy number: 2  Recommendations: 1. 01/17/20: Left lumpectomy Donne Hazel): IDC, grade 2, 2.1cm, with high grade DCIS, carcinoma extending to the lateral and superior margin juncture, 5 right axillary lymph nodes negative for carcinoma.  2. Oncotype DX: 15, ROR 4% 3. Adjuvant radiation therapy started 03/18/20 4. Adjuvant antiestrogen therapy ------------------------------------------------------------------------------------------------------------- Treatment Plan: Anastrozole 1 mg daily to start 05/01/2020 (patient is postmenopausal)  Anastrozole toxicities:

## 2020-05-07 ENCOUNTER — Encounter: Payer: Self-pay | Admitting: Hematology and Oncology

## 2020-05-10 NOTE — Progress Notes (Signed)
Radiation Oncology         [phone]) [phone] ________________________________  Name: Shelly [name] MRN: [id number]  Date: 05/11/2020  DOB: 12/31/1970  Follow-Up Visit Note  CC: Shelly Baars, MD  Shelly Croissant, MD    ICD-10-CM   1. Malignant neoplasm of upper-outer quadrant of left breast in female, estrogen receptor positive ([facility])  C50.412    Z17.0     Diagnosis: Stage IA (pT2, pN0) Left Breast UOQ, Invasive Ductal Carcinoma with high-grade DCIS, ER+ / PR+ / Her2-, Grade 2  Interval Since Last Radiation: One month and three days  Radiation Treatment Dates: 03/17/2020 through 04/07/2020  Site: Left breast Technique: 3D Total Dose (Gy): 42.72/42.72 Dose per Fx (Gy): 2.67 Completed Fx: 16/16 Beam Energies: 6XFFF  Narrative:  The patient returns today for routine follow-up. She was last seen by Dr. Pamelia Hoit on 04/30/2020 and began tamoxifen on 05/01/2020.  She has noticed problems with hot flashes since starting this medication.  On review of systems, she reports improving sensitivity along the nipple area.  She denies any nipple discharge or bleeding she continues to have some discomfort within the left breast but no real pain.. She denies swelling in her left arm or hand.  She did have significant fatigue after she completed the radiation therapy but is almost back to baseline at this time..  ALLERGIES:  has No Known Allergies.  Meds: Current Outpatient Medications  Medication Sig Dispense Refill  . guaiFENesin (MUCINEX) 600 MG 12 hr tablet Take by mouth 2 (two) times daily.    Marland Kitchen ibuprofen (ADVIL,MOTRIN) 200 MG tablet Take 200 mg by mouth every 6 (six) hours as needed.    . tamoxifen (NOLVADEX) 20 MG tablet Take 1 tablet (20 mg total) by mouth daily. 90 tablet 1   No current facility-administered medications for this encounter.    Physical Findings: The patient is in no acute distress. Patient is alert and oriented.  height is 5\' 6"  (1.676 m) and weight is 138 lb 6.4  oz (62.8 kg). Her temperature is 97.7 F (36.5 C). Her blood pressure is 105/75 and her pulse is 63. Her respiration is 18 and oxygen saturation is 100%.   Lungs are clear to auscultation bilaterally. Heart has regular rate and rhythm. No palpable cervical, supraclavicular, or axillary adenopathy. Abdomen soft, non-tender, normal bowel sounds. Right breast: No palpable mass, nipple discharge, or bleeding. Left breast: Skin is healed well.  Very mild hyperpigmentation changes noted.  No dominant mass appreciated in the breast nipple discharge or bleeding.  Patient has some mild swelling in the breast consistent with radiation effect.  Lab Findings: Lab Results  Component Value Date   WBC 5.7 12/28/2011   HGB 13.0 09/03/2012   HCT 41.0 05/21/2012   MCV 89.1 12/28/2011   PLT 203 12/28/2011    Radiographic Findings: No results found.  Impression: Stage IA (pT2, pN0) Left Breast UOQ, Invasive Ductal Carcinoma with high-grade DCIS, ER+ / PR+ / Her2-, Grade 2  The patient has recovered well from her radiation therapy.  No signs of recurrence on clinical exam today.  Plan: The patient is scheduled to follow up with 12/30/2011, NP, on 07/09/2020. She will follow up with radiation oncology in as needed basis in light of her close follow-up with medical oncology.  She will also continue to follow-up with general surgery.    ____________________________________   09/08/2020, PhD, MD  This document serves as a record of services personally performed by Billie Lade, MD.  It was created on his behalf by Clerance Lav, a trained medical scribe. The creation of this record is based on the scribe's personal observations and the provider's statements to them. This document has been checked and approved by the attending provider.

## 2020-05-10 NOTE — Progress Notes (Incomplete)
  Patient Name: Shelly [name] MRN: [id number] DOB: 30-Jan-1971 Referring Physician: Nicholas Lose (Profile Not Attached) Date of Service: 04/07/2020 [facility], [place]                                                        End Of Treatment Note  Diagnoses: C50.412-Malignant neoplasm of upper-outer quadrant of left female breast  Cancer Staging: Stage IA (pT2, pN0) Left Breast UOQ, Invasive Ductal Carcinoma with high-grade DCIS, ER+ / PR+ / Her2-, Grade 2  Intent: Curative  Radiation Treatment Dates: 03/17/2020 through 04/07/2020  Site: Left breast Technique: 3D Total Dose (Gy): 42.72/42.72 Dose per Fx (Gy): 2.67 Completed Fx: 16/16 Beam Energies: 6XFFF  Narrative: The patient tolerated radiation therapy relatively well. She did report occasional fatigue and some itching to the treatment area. Appetite remained stable. She denied swelling to the left arm and limitation of movement of left arm. The left breast area showed some mild erythema and radiation dermatitis in the upper inner aspect of the treatment area as well as the inframammary fold. There were no signs of infection or skin breakdown.  Plan: The patient will follow-up with radiation oncology in one month.  ________________________________________________   Blair Promise, PhD, MD  This document serves as a record of services personally performed by Gery Pray, MD. It was created on his behalf by Clerance Lav, a trained medical scribe. The creation of this record is based on the scribe's personal observations and the provider's statements to them. This document has been checked and approved by the attending provider.

## 2020-05-11 ENCOUNTER — Encounter: Payer: Self-pay | Admitting: Radiation Oncology

## 2020-05-11 ENCOUNTER — Ambulatory Visit
Admission: RE | Admit: 2020-05-11 | Discharge: 2020-05-11 | Disposition: A | Discharge status: Home / Self Care | Payer: BC Managed Care – PPO | Source: Ambulatory Visit | Attending: Radiation Oncology | Admitting: Radiation Oncology

## 2020-05-11 ENCOUNTER — Other Ambulatory Visit: Payer: Self-pay

## 2020-05-11 VITALS — BP 105/75 | HR 63 | Temp 97.7°F | Resp 18 | Ht 66.0 in | Wt 138.4 lb

## 2020-05-11 DIAGNOSIS — Z923 Personal history of irradiation: Secondary | ICD-10-CM | POA: Insufficient documentation

## 2020-05-11 DIAGNOSIS — Z79810 Long term (current) use of selective estrogen receptor modulators (SERMs): Secondary | ICD-10-CM | POA: Insufficient documentation

## 2020-05-11 DIAGNOSIS — Z170 Estrogen receptor positive status [ER+]: Secondary | ICD-10-CM | POA: Diagnosis not present

## 2020-05-11 DIAGNOSIS — C50412 Malignant neoplasm of upper-outer quadrant of left female breast: Secondary | ICD-10-CM

## 2020-05-11 DIAGNOSIS — R232 Flushing: Secondary | ICD-10-CM | POA: Insufficient documentation

## 2020-05-11 NOTE — Progress Notes (Signed)
She is currently in no pain.  Pt complains of mild fatigue .   Pt left breast- positive for Dryness, Pruritus, mild swelling, erythema and breast tenderness.    Pt reports numbness and tenderness to left axilla.   Pt continues to apply Radiaplex and Hydrocortisone as directed.  BP 105/75 (BP Location: Right Arm, Patient Position: Sitting, Cuff Size: Normal)   Pulse 63   Temp 97.7 F (36.5 C)   Resp 18   Ht 5\' 6"  (1.676 m)   Wt 138 lb 6.4 oz (62.8 kg)   LMP 12/28/2011   SpO2 100%   BMI 22.34 kg/m

## 2020-07-01 ENCOUNTER — Encounter: Payer: Self-pay | Admitting: Physician Assistant

## 2020-07-01 ENCOUNTER — Other Ambulatory Visit: Payer: Self-pay

## 2020-07-01 ENCOUNTER — Ambulatory Visit (INDEPENDENT_AMBULATORY_CARE_PROVIDER_SITE_OTHER): Payer: BC Managed Care – PPO | Admitting: Physician Assistant

## 2020-07-01 DIAGNOSIS — C44311 Basal cell carcinoma of skin of nose: Secondary | ICD-10-CM

## 2020-07-01 DIAGNOSIS — Z1283 Encounter for screening for malignant neoplasm of skin: Secondary | ICD-10-CM

## 2020-07-01 DIAGNOSIS — C4491 Basal cell carcinoma of skin, unspecified: Secondary | ICD-10-CM

## 2020-07-01 DIAGNOSIS — D485 Neoplasm of uncertain behavior of skin: Secondary | ICD-10-CM

## 2020-07-01 HISTORY — DX: Basal cell carcinoma of skin, unspecified: C44.91

## 2020-07-01 NOTE — Progress Notes (Signed)
   New Patient   Subjective  Shelly [name] is a 50 y.o. female who presents for the following: Annual Exam (Back keratoses that itch, shins, back, hips are dry itchy comes and goes, eczema on hands in the winter no previous treatments. right nose pink scale that bleeds for months,  left forehead age spot per patient).   The following portions of the chart were reviewed this encounter and updated as appropriate:  Tobacco  Allergies  Meds  Problems  Med Hx  Surg Hx  Fam Hx      Objective  Well appearing patient in no apparent distress; mood and affect are within normal limits.  A full examination was performed including scalp, head, eyes, ears, nose, lips, neck, chest, axillae, abdomen, back, buttocks, bilateral upper extremities, bilateral lower extremities, hands, feet, fingers, toes, fingernails, and toenails. All findings within normal limits unless otherwise noted below.  Objective  Right Ala Nasi: Pearly papule with telangectasia.       Assessment & Plan  Neoplasm of uncertain behavior of skin Right Ala Nasi  Skin / nail biopsy Type of biopsy: tangential   Informed consent: discussed and consent obtained   Timeout: patient name, date of birth, surgical site, and procedure verified   Procedure prep:  Patient was prepped and draped in usual sterile fashion (Non sterile) Prep type:  Chlorhexidine Anesthesia: the lesion was anesthetized in a standard fashion   Anesthetic:  1% lidocaine w/ epinephrine 1-100,000 local infiltration Instrument used: flexible razor blade   Outcome: patient tolerated procedure well   Post-procedure details: wound care instructions given    Specimen 1 - Surgical pathology Differential Diagnosis: bcc vs scc  Check Margins: No  I, Trevin Gartrell, PA-C, have reviewed all documentation for this visit. The documentation on 07/01/20 for the exam, diagnosis, procedures, and orders are all accurate and complete.

## 2020-07-01 NOTE — Patient Instructions (Signed)

## 2020-07-08 ENCOUNTER — Telehealth: Payer: Self-pay

## 2020-07-08 NOTE — Progress Notes (Signed)
SURVIVORSHIP  VISIT:   BRIEF ONCOLOGIC HISTORY:  Oncology History  Malignant neoplasm of upper-outer quadrant of left breast in female, estrogen receptor positive ([place])  12/31/2019 Initial Diagnosis   Screening detected left breast mass, 1 o'clock position: 1.3 cm by ultrasound (about 2 cm by mammogram), 1 indeterminate left axillary lymph node. 12/31/2019: Biopsy revealed grade 2 IDC with DCIS, lymph node benign, ER greater than 95%, PR greater than 95%, Ki-67 10%, HER-2 negative ratio 1.18, copy number: 2   01/10/2020 Cancer Staging   Staging form: Breast, AJCC 8th Edition - Clinical: Stage IA (cT1c, cN0, cM0, G2, ER+, PR+, HER2-)   01/17/2020 Surgery   Left lumpectomy Donne Hazel) [phone]): IDC, grade 2, 2.1cm, with high grade DCIS, carcinoma extending to the lateral and superior margin juncture, 5 right axillary lymph nodes negative for carcinoma.    02/04/2020 Oncotype testing   The Oncotype DX score was 15 predicting a risk of outside the breast recurrence over the next 9 years of 4% if the patient's only systemic therapy is tamoxifen for 5 years.    02/06/2020 Surgery   Re-excision of the lateral margin Donne Hazel) [phone]): no residual carcinoma.    03/17/2020 - 04/07/2020 Radiation Therapy   The patient initially received a dose of 42.72 Gy in 16 fractions to the breast using whole-breast tangent fields. This was delivered using a 3-D conformal technique. The total dose was 42.72 Gy.   05/2020 -  Anti-estrogen oral therapy   Tamoxifen     INTERVAL HISTORY:  Shelly [name] to review her survivorship care plan detailing her treatment course for breast cancer, as well as monitoring long-term side effects of that treatment, education regarding health maintenance, screening, and overall wellness and health promotion.     Overall, Shelly [name] reports feeling quite well.  She is taking Tamoxifen daily and is tolerating it quite well.  She had a biopsy of a skin lesion on her  nose and it was basal cell and she was referred to see Dr. Lindi Adie again.  She is concerned about this referral.    REVIEW OF SYSTEMS:  Review of Systems  Constitutional:  Negative for appetite change, chills, fatigue, fever and unexpected weight change.  HENT:   Negative for hearing loss, lump/mass and trouble swallowing.   Eyes:  Negative for eye problems and icterus.  Respiratory:  Negative for chest tightness, cough and shortness of breath.   Cardiovascular:  Negative for chest pain, leg swelling and palpitations.  Gastrointestinal:  Negative for abdominal distention, abdominal pain, constipation, diarrhea, nausea and vomiting.  Endocrine: Negative for hot flashes.  Genitourinary:  Negative for difficulty urinating.   Musculoskeletal:  Negative for arthralgias.  Skin:  Negative for itching and rash.  Neurological:  Negative for dizziness, extremity weakness, headaches and numbness.  Hematological:  Negative for adenopathy. Does not bruise/bleed easily.  Psychiatric/Behavioral:  Negative for depression. The patient is not nervous/anxious.   Breast: Denies any new nodularity, masses, tenderness, nipple changes, or nipple discharge.      ONCOLOGY TREATMENT TEAM:  1. Surgeon:  Dr. Donne Hazel at [facility] 2. Medical Oncologist: Dr. Lindi Adie  3. Radiation Oncologist: Dr. Sondra Come    PAST MEDICAL/SURGICAL HISTORY:  Past Medical History:  Diagnosis Date   Cancer [facility]) 01/2020   left breast IDC with DCIS   Fibroid 01-2012   Robotic TLH uterus 700g   History of radiation therapy 03/18/20-04/07/20   Left breast- Adjuvant radiation,- Dr. Sondra Come    No pertinent past medical history  Nodular basal cell carcinoma (BCC) 07/01/2020   Right [place]   PONV (postoperative nausea and vomiting)    Past Surgical History:  Procedure Laterality Date   ABDOMINAL HYSTERECTOMY     BREAST LUMPECTOMY WITH RADIOACTIVE SEED AND SENTINEL LYMPH NODE BIOPSY Left 01/17/2020   Procedure:  LEFT BREAST LUMPECTOMY WITH RADIOACTIVE SEED AND LEFT AXILLARY SENTINEL LYMPH NODE BIOPSY;  Surgeon: Rolm Bookbinder, MD;  Location: [facility];  Service: General;  Laterality: Left;  PECTORAL BLOCK   CYSTOSCOPY  01/02/2012   Procedure: CYSTOSCOPY;  Surgeon: Peri Maris, MD;  Location: [facility];  Service: Gynecology;  Laterality: N/A;   DILATION AND CURETTAGE OF UTERUS     HYSTEROSCOPY     OPEN REDUCTION INTERNAL FIXATION (ORIF) DISTAL RADIAL FRACTURE Left 03/27/2016   Procedure: OPEN REDUCTION INTERNAL FIXATION (ORIF) DISTAL RADIAL FRACTURE;  Surgeon: Iran Planas, MD;  Location: [facility];  Service: Orthopedics;  Laterality: Left;   RE-EXCISION OF BREAST LUMPECTOMY Left 02/06/2020   Procedure: RE-EXCISION OF LEFT BREAST LUMPECTOMY;  Surgeon: Rolm Bookbinder, MD;  Location: [facility];  Service: General;  Laterality: Left;   ROBOTIC ASSISTED TOTAL HYSTERECTOMY  01/02/2012   Procedure: ROBOTIC ASSISTED TOTAL HYSTERECTOMY;  Surgeon: Peri Maris, MD;  Location: [facility];  Service: Gynecology;  Laterality: N/A;  Roboitc Bilateral Salpingectomy    TONSILLECTOMY     uterine ablation       ALLERGIES:  No Known Allergies   CURRENT MEDICATIONS:  Outpatient Encounter Medications as of 07/09/2020  Medication Sig   guaiFENesin (MUCINEX) 600 MG 12 hr tablet Take by mouth 2 (two) times daily.   ibuprofen (ADVIL,MOTRIN) 200 MG tablet Take 200 mg by mouth every 6 (six) hours as needed.   tamoxifen (NOLVADEX) 20 MG tablet Take 1 tablet (20 mg total) by mouth daily.   No facility-administered encounter medications on file as of 07/09/2020.     ONCOLOGIC FAMILY HISTORY:  Family History  Problem Relation Age of Onset   Thyroid disease Mother    Breast cancer Mother 38       breast cancer     GENETIC COUNSELING/TESTING: Not at this time  SOCIAL HISTORY:  Social History   Socioeconomic History   Marital status: Married    Spouse name: Not on file   Number of  children: Not on file   Years of education: Not on file   Highest education level: Not on file  Occupational History   Not on file  Tobacco Use   Smoking status: Former    Pack years: 0.00    Types: Cigarettes    Quit date: 02/01/1996    Years since quitting: 24.4   Smokeless tobacco: Never  Vaping Use   Vaping Use: Never used  Substance and Sexual Activity   Alcohol use: Not Currently   Drug use: No   Sexual activity: Yes    Partners: Female    Birth control/protection: None  Other Topics Concern   Not on file  Social History Narrative   Not on file   Social Determinants of Health   Financial Resource Strain: Not on file  Food Insecurity: Not on file  Transportation Needs: Not on file  Physical Activity: Not on file  Stress: Not on file  Social Connections: Not on file  Intimate Partner Violence: Not on file     OBSERVATIONS/OBJECTIVE:  BP 121/68   Pulse 67   Temp 98.1 F (36.7 C)   Resp 20   Wt  144 lb 9.6 oz (65.6 kg)   LMP 12/28/2011   SpO2 100%   BMI 23.34 kg/m  GENERAL: Patient is a well appearing female in no acute distress HEENT:  Sclerae anicteric.  Mask in place Neck is supple.  NODES:  No cervical, supraclavicular, or axillary lymphadenopathy palpated.  BREAST EXAM:  left breast s/u lumpectomy and radiation, no sign of local recurrence, right breast benign LUNGS:  Clear to auscultation bilaterally.  No wheezes or rhonchi. HEART:  Regular rate and rhythm. No murmur appreciated. ABDOMEN:  Soft, nontender.  Positive, normoactive bowel sounds. No organomegaly palpated. MSK:  No focal spinal tenderness to palpation. Full range of motion bilaterally in the upper extremities. EXTREMITIES:  No peripheral edema.   SKIN:  Clear with no obvious rashes or skin changes. No nail dyscrasia. NEURO:  Nonfocal. Well oriented.  Appropriate affect.   LABORATORY DATA:  None for this visit.  DIAGNOSTIC IMAGING:  None for this visit.      ASSESSMENT AND PLAN:   Shelly [name] is a pleasant 50 y.o. female with Stage IA left breast invasive ductal carcinoma, ER+/PR+/HER2-, diagnosed in 01/2020, treated with lumpectomy, adjuvant radiation therapy, and anti-estrogen therapy with Tamoxifen beginning in 05/2020.  She presents to the Survivorship Clinic for our initial meeting and routine follow-up post-completion of treatment for breast cancer.    1. Stage IA left breast cancer:  Shelly [name] is continuing to recover from definitive treatment for breast cancer. She will follow-up with her medical oncologist, Dr. Lindi Adie in 6 months with history and physical exam per surveillance protocol.  She will continue her anti-estrogen therapy with Tmaoxifen. Thus far, she is tolerating the Tamoxifen well, with minimal side effects. She was instructed to make Dr. Lindi Adie or myself aware if she begins to experience any worsening side effects of the medication and I could see her back in clinic to help manage those side effects, as needed.   Her mammogram is due 12/2020; orders placed today. Today, a comprehensive survivorship care plan and treatment summary was reviewed with the patient today detailing her breast cancer diagnosis, treatment course, potential late/long-term effects of treatment, appropriate follow-up care with recommendations for the future, and patient education resources.  A copy of this summary, along with a letter will be sent to the patient's primary care provider via mail/fax/In Basket message after today's visit.    2. Skin cancer: She briefly met with Dr. Lindi Adie, who reviewed her path report and let her know that she does not need to come to tomorrow's appointment.  She will f/u with her dermatologist for her MOHS surgery.  3. Bone health:    She was given education on specific activities to promote bone health.  4. Cancer screening:  Due to Shelly [name] history and her age, she should receive screening for skin cancers, colon cancer, and gynecologic cancers.   The information and recommendations are listed on the patient's comprehensive care plan/treatment summary and were reviewed in detail with the patient.    5. Health maintenance and wellness promotion: Shelly [name] was encouraged to consume 5-7 servings of fruits and vegetables per day. We reviewed the "Nutrition Rainbow" handout, as well as the handout "Take Control of Your Health and Reduce Your Cancer Risk" from the [employer].  She was also encouraged to engage in moderate to vigorous exercise for 30 minutes per day most days of the week. We discussed the [employer] fitness program, which is designed for cancer survivors to help them become  more physically fit after cancer treatments.  She was instructed to limit her alcohol consumption and continue to abstain from tobacco use.     6. Support services/counseling: It is not uncommon for this period of the patient's cancer care trajectory to be one of many emotions and stressors.  We discussed how this can be increasingly difficult during the times of quarantine and social distancing due to the COVID-19 pandemic.   She was given information regarding our available services and encouraged to contact me with any questions or for help enrolling in any of our support group/programs.    Follow up instructions:    -Return to cancer center in 6 months for f/u with Dr. Lindi Adie  -Mammogram due in 12/2020 -Follow up with surgery 1 year -She is welcome to return back to the Survivorship Clinic at any time; no additional follow-up needed at this time.  -Consider referral back to survivorship as a long-term survivor for continued surveillance  The patient was provided an opportunity to ask questions and all were answered. The patient agreed with the plan and demonstrated an understanding of the instructions.   Wilber Bihari, NP 07/09/20 2:57 PM Medical Oncology and Hematology [facility] [place], [place]  [address] Tel. [phone]    Fax. [phone]  Total encounter time: 30 minutes* face to face visit time, chart review, SCP preparation and review, collaboration with Dr. Lindi Adie, order entry, and documentation of the encounter.  *Total Encounter Time as defined by the Centers for Medicare and Medicaid Services includes, in addition to the face-to-face time of a patient visit (documented in the note above) non-face-to-face time: obtaining and reviewing outside history, ordering and reviewing medications, tests or procedures, care coordination (communications with other health care professionals or caregivers) and documentation in the medical record.

## 2020-07-08 NOTE — Telephone Encounter (Signed)
Phone call to patient with her pathology results. Patient aware of results. MOH's info sent.

## 2020-07-08 NOTE — Telephone Encounter (Signed)
-----   Message from Warren Danes, [place] sent at 07/08/2020  8:04 AM EDT ----- Mohs

## 2020-07-09 ENCOUNTER — Inpatient Hospital Stay: Payer: BC Managed Care – PPO | Attending: Hematology and Oncology | Admitting: Adult Health

## 2020-07-09 ENCOUNTER — Telehealth: Payer: Self-pay | Admitting: Hematology and Oncology

## 2020-07-09 ENCOUNTER — Other Ambulatory Visit: Payer: Self-pay

## 2020-07-09 VITALS — BP 121/68 | HR 67 | Temp 98.1°F | Resp 20 | Wt 144.6 lb

## 2020-07-09 DIAGNOSIS — Z803 Family history of malignant neoplasm of breast: Secondary | ICD-10-CM | POA: Diagnosis not present

## 2020-07-09 DIAGNOSIS — Z923 Personal history of irradiation: Secondary | ICD-10-CM | POA: Diagnosis not present

## 2020-07-09 DIAGNOSIS — C44311 Basal cell carcinoma of skin of nose: Secondary | ICD-10-CM | POA: Diagnosis not present

## 2020-07-09 DIAGNOSIS — C50412 Malignant neoplasm of upper-outer quadrant of left female breast: Secondary | ICD-10-CM | POA: Insufficient documentation

## 2020-07-09 DIAGNOSIS — Z87891 Personal history of nicotine dependence: Secondary | ICD-10-CM | POA: Insufficient documentation

## 2020-07-09 DIAGNOSIS — Z90710 Acquired absence of both cervix and uterus: Secondary | ICD-10-CM | POA: Insufficient documentation

## 2020-07-09 DIAGNOSIS — Z170 Estrogen receptor positive status [ER+]: Secondary | ICD-10-CM | POA: Insufficient documentation

## 2020-07-09 DIAGNOSIS — Z79810 Long term (current) use of selective estrogen receptor modulators (SERMs): Secondary | ICD-10-CM | POA: Diagnosis not present

## 2020-07-09 NOTE — Assessment & Plan Note (Deleted)
Screening detected left breast mass, 1 o'clock position: 1.3 cm by ultrasound (about 2 cm by mammogram), 1 indeterminate left axillary lymph node. 12/31/2019: Biopsy revealed grade 2 IDC with DCIS, lymph node benign, ER greater than 95%, PR greater than 95%, Ki-67 10%, HER-2 negative ratio 1.18, copy number: 2  Recommendations: 1.01/17/20:Left lumpectomy Shelly [name]): IDC, grade 2, 2.1cm, with high grade DCIS, carcinoma extending to the lateral and superior margin juncture, 5 right axillary lymph nodes negative for carcinoma. 2. Oncotype DX: 15, ROR 4% 3. Adjuvant radiation therapystarted 03/18/20 4. Adjuvant antiestrogen therapy ------------------------------------------------------------------------------------------------------------- Treatment Plan: Tamoxifen 20 mg daily to start 05/01/2020   Reason for his changing from anastrozole to tamoxifen was that she had [place] level of 24 on 04/22/2020 and surprisingly it went up to 39 on 04/29/2020.  (She had a hysterectomy previously)   Given the fact that she had an estradiol level of 24 we felt that it would not be safe to give anastrozole. My recommendation is to give her tamoxifen and we can recheck her [facility] in 6 months and then determine if we can switch her to anastrozole at that time labs at her gynecologist office  RTC in 1 year

## 2020-07-09 NOTE — Telephone Encounter (Signed)
Scheduled appt per 6/9 referral. Pt aware.  

## 2020-07-10 ENCOUNTER — Inpatient Hospital Stay: Payer: BC Managed Care – PPO | Admitting: Hematology and Oncology

## 2020-07-10 ENCOUNTER — Encounter: Payer: Self-pay | Admitting: Adult Health

## 2020-07-23 ENCOUNTER — Ambulatory Visit
Admission: RE | Admit: 2020-07-23 | Discharge: 2020-07-23 | Disposition: A | Discharge status: Home / Self Care | Payer: BC Managed Care – PPO | Source: Ambulatory Visit | Attending: Adult Health | Admitting: Adult Health

## 2020-07-23 ENCOUNTER — Other Ambulatory Visit: Payer: Self-pay | Admitting: Adult Health

## 2020-07-23 ENCOUNTER — Other Ambulatory Visit: Payer: Self-pay

## 2020-07-23 DIAGNOSIS — C50412 Malignant neoplasm of upper-outer quadrant of left female breast: Secondary | ICD-10-CM

## 2020-07-23 DIAGNOSIS — Z170 Estrogen receptor positive status [ER+]: Secondary | ICD-10-CM

## 2020-07-23 HISTORY — DX: Personal history of irradiation: Z92.3

## 2020-10-21 ENCOUNTER — Other Ambulatory Visit: Payer: Self-pay | Admitting: Hematology and Oncology

## 2020-10-28 NOTE — Progress Notes (Signed)
Patient Care Team: Jerelyn Charles, MD as PCP - General (Obstetrics) Gery Pray, MD as Consulting Physician (Radiation Oncology) Nicholas Lose, MD as Consulting Physician (Hematology and Oncology) Rolm Bookbinder, MD as Consulting Physician (General Surgery) Warren Danes, PA-C as Physician Assistant (Dermatology)  DIAGNOSIS:    ICD-10-CM   1. Malignant neoplasm of upper-outer quadrant of left breast in female, estrogen receptor positive ([place])  C50.412    Z17.0       SUMMARY OF ONCOLOGIC HISTORY: Oncology History  Malignant neoplasm of upper-outer quadrant of left breast in female, estrogen receptor positive ([place])  12/31/2019 Initial Diagnosis   Screening detected left breast mass, 1 o'clock position: 1.3 cm by ultrasound (about 2 cm by mammogram), 1 indeterminate left axillary lymph node. 12/31/2019: Biopsy revealed grade 2 IDC with DCIS, lymph node benign, ER greater than 95%, PR greater than 95%, Ki-67 10%, HER-2 negative ratio 1.18, copy number: 2   01/10/2020 Cancer Staging   Staging form: Breast, AJCC 8th Edition - Clinical: Stage IA (cT1c, cN0, cM0, G2, ER+, PR+, HER2-)   01/17/2020 Surgery   Left lumpectomy Donne Hazel) [phone]): IDC, grade 2, 2.1cm, with high grade DCIS, carcinoma extending to the lateral and superior margin juncture, 5 right axillary lymph nodes negative for carcinoma.    02/04/2020 Oncotype testing   The Oncotype DX score was 15 predicting a risk of outside the breast recurrence over the next 9 years of 4% if the patient's only systemic therapy is tamoxifen for 5 years.    02/06/2020 Surgery   Re-excision of the lateral margin Donne Hazel) [phone]): no residual carcinoma.    03/17/2020 - 04/07/2020 Radiation Therapy   The patient initially received a dose of 42.72 Gy in 16 fractions to the breast using whole-breast tangent fields. This was delivered using a 3-D conformal technique. The total dose was 42.72 Gy.   05/2020 -   Anti-estrogen oral therapy   Tamoxifen     CHIEF COMPLIANT: Follow-up of left breast cancer on anastrozole   INTERVAL HISTORY: Shelly [name] is a 50 y.o. with above-mentioned history of left breast cancer who underwent a left lumpectomy followed by re-excision, radiation, currently on antiestrogen therapy with tamoxifen. She presents to the clinic today for follow-up.  She is complaining of significant side effects to tamoxifen therapy.  She does have hot flashes as well as mood swings.  ALLERGIES:  has No Known Allergies.  MEDICATIONS:  Current Outpatient Medications  Medication Sig Dispense Refill   guaiFENesin (MUCINEX) 600 MG 12 hr tablet Take by mouth 2 (two) times daily.     ibuprofen (ADVIL,MOTRIN) 200 MG tablet Take 200 mg by mouth every 6 (six) hours as needed.     tamoxifen (NOLVADEX) 20 MG tablet TAKE 1 TABLET BY MOUTH EVERY DAY 90 tablet 1   No current facility-administered medications for this visit.    PHYSICAL EXAMINATION: ECOG PERFORMANCE STATUS: 1 - Symptomatic but completely ambulatory  Vitals:   10/29/20 1513  BP: 124/72  Pulse: 67  Resp: 18  Temp: 97.7 F (36.5 C)  SpO2: 99%   Filed Weights   10/29/20 1513  Weight: 141 lb 11.2 oz (64.3 kg)    BREAST: No palpable masses or nodules in either right or left breasts. No palpable axillary supraclavicular or infraclavicular adenopathy no breast tenderness or nipple discharge. (exam performed in the presence of a chaperone)  LABORATORY DATA:  I have reviewed the data as listed CMP Latest Ref Rng & Units 05/21/2012 01/02/2012  Glucose 70 -  99 mg/dL 83 93  BUN 6 - 23 mg/dL 14 10  Creatinine 0.50 - 1.10 mg/dL 0.80 0.66  Sodium 135 - 145 mEq/L 143 138  Potassium 3.5 - 5.1 mEq/L 3.8 3.6  Chloride 96 - 112 mEq/L 102 103  CO2 19 - 32 mEq/L - 28  Calcium 8.4 - 10.5 mg/dL - 8.9  Total Protein 6.0 - 8.3 g/dL - 7.0  Total Bilirubin 0.3 - 1.2 mg/dL - 0.3  Alkaline Phos 39 - 117 U/L - 46  AST 0 - 37 U/L -  17  ALT 0 - 35 U/L - 9    Lab Results  Component Value Date   WBC 5.7 12/28/2011   HGB 13.0 09/03/2012   HCT 41.0 05/21/2012   MCV 89.1 12/28/2011   PLT 203 12/28/2011    ASSESSMENT & PLAN:  Malignant neoplasm of upper-outer quadrant of left breast in female, estrogen receptor positive ([place]) Screening detected left breast mass, 1 o'clock position: 1.3 cm by ultrasound (about 2 cm by mammogram), 1 indeterminate left axillary lymph node. 12/31/2019: Biopsy revealed grade 2 IDC with DCIS, lymph node benign, ER greater than 95%, PR greater than 95%, Ki-67 10%, HER-2 negative ratio 1.18, copy number: 2   Recommendations: 1. 01/17/20: Left lumpectomy Donne Hazel): IDC, grade 2, 2.1cm, with high grade DCIS, carcinoma extending to the lateral and superior margin juncture, 5 right axillary lymph nodes negative for carcinoma.  2. Oncotype DX: 15, ROR 4% 3. Adjuvant radiation therapy started 03/18/20 4. Adjuvant antiestrogen therapy ------------------------------------------------------------------------------------------------------------- Treatment Plan:  Tamoxifen 20 mg daily started 05/01/2020   Reason for his changing from anastrozole to tamoxifen was that she had [place] level of 24 on 04/22/2020 and surprisingly it went up to 39 on 04/29/2020.  (She had a hysterectomy previously) Recent [place] at her gynecologist: 30 Therefore she is not in menopause by lab criteria.  Tamoxifen toxicities: Severe hot flashes emotional changes mental cloudiness I recommended that she start Effexor 37.5 mg daily she is willing to try it. I sent the prescription.  Breast cancer surveillance: Patient will need bilateral mammograms next month.  I sent an order   Return to clinic in 1 year for follow-up  No orders of the defined types were placed in this encounter.  The patient has a good understanding of the overall plan. she agrees with it. she will call with any problems that may develop before the next visit  here.  Total time spent: 30 mins including face to face time and time spent for planning, charting and coordination of care  Rulon Eisenmenger, MD, MPH 10/29/2020  I, Thana Ates, am acting as scribe for Dr. Nicholas Lose.  I have reviewed the above documentation for accuracy and completeness, and I agree with the above.

## 2020-10-29 ENCOUNTER — Inpatient Hospital Stay: Payer: BC Managed Care – PPO | Attending: Hematology and Oncology | Admitting: Hematology and Oncology

## 2020-10-29 ENCOUNTER — Other Ambulatory Visit: Payer: Self-pay

## 2020-10-29 DIAGNOSIS — C50412 Malignant neoplasm of upper-outer quadrant of left female breast: Secondary | ICD-10-CM | POA: Diagnosis present

## 2020-10-29 DIAGNOSIS — Z923 Personal history of irradiation: Secondary | ICD-10-CM | POA: Diagnosis not present

## 2020-10-29 DIAGNOSIS — N951 Menopausal and female climacteric states: Secondary | ICD-10-CM | POA: Diagnosis not present

## 2020-10-29 DIAGNOSIS — Z79810 Long term (current) use of selective estrogen receptor modulators (SERMs): Secondary | ICD-10-CM | POA: Insufficient documentation

## 2020-10-29 DIAGNOSIS — Z170 Estrogen receptor positive status [ER+]: Secondary | ICD-10-CM

## 2020-10-29 MED ORDER — VENLAFAXINE HCL ER 37.5 MG PO CP24
37.5000 mg | ORAL_CAPSULE | Freq: Every day | ORAL | 11 refills | Status: DC
Start: 2020-10-29 — End: 2020-11-23

## 2020-10-29 NOTE — Assessment & Plan Note (Signed)
Screening detected left breast mass, 1 o'clock position: 1.3 cm by ultrasound (about 2 cm by mammogram), 1 indeterminate left axillary lymph node. 12/31/2019: Biopsy revealed grade 2 IDC with DCIS, lymph node benign, ER greater than 95%, PR greater than 95%, Ki-67 10%, HER-2 negative ratio 1.18, copy number: 2  Recommendations: 1.01/17/20:Left lumpectomy ([place]): IDC, grade 2, 2.1cm, with high grade DCIS, carcinoma extending to the lateral and superior margin juncture, 5 right axillary lymph nodes negative for carcinoma. 2. Oncotype DX: 15, ROR 4% 3. Adjuvant radiation therapystarted 03/18/20 4. Adjuvant antiestrogen therapy ------------------------------------------------------------------------------------------------------------- Treatment Plan: Tamoxifen 20 mg daily started 05/01/2020   Reason for his changing from anastrozole to tamoxifen was that she had FSH level of 24 on 04/22/2020 and surprisingly it went up to 39 on 04/29/2020.  (She had a hysterectomy previously)  Repeat labs September 2022 at GYN:     

## 2020-11-21 ENCOUNTER — Other Ambulatory Visit: Payer: Self-pay | Admitting: Hematology and Oncology

## 2020-12-04 ENCOUNTER — Ambulatory Visit
Admission: RE | Admit: 2020-12-04 | Discharge: 2020-12-04 | Disposition: A | Discharge status: Home / Self Care | Payer: BC Managed Care – PPO | Source: Ambulatory Visit | Attending: Hematology and Oncology | Admitting: Hematology and Oncology

## 2020-12-04 ENCOUNTER — Other Ambulatory Visit: Payer: Self-pay

## 2020-12-04 DIAGNOSIS — C50412 Malignant neoplasm of upper-outer quadrant of left female breast: Secondary | ICD-10-CM

## 2021-03-16 ENCOUNTER — Encounter (HOSPITAL_COMMUNITY): Payer: Self-pay

## 2021-04-28 ENCOUNTER — Other Ambulatory Visit: Payer: Self-pay | Admitting: Hematology and Oncology

## 2021-06-09 ENCOUNTER — Encounter: Payer: Self-pay | Admitting: Hematology and Oncology

## 2021-06-09 ENCOUNTER — Other Ambulatory Visit: Payer: Self-pay | Admitting: *Deleted

## 2021-06-09 MED ORDER — VENLAFAXINE HCL ER 75 MG PO CP24: 75.0000 mg | ORAL_CAPSULE | Freq: Every day | ORAL | 3 refills | Status: DC

## 2021-06-09 NOTE — Progress Notes (Signed)
Received message from pt with complaint of ongoing hot flashes despite being on Effexor 37.5 mg XR.  Verbal orders received from MD to increase pt Effexor to 75 mg XR p.o daily. Prescription sent to pharmacy on file.  Pt educated and verbalized understanding.  ?

## 2021-09-21 ENCOUNTER — Encounter: Payer: Self-pay | Admitting: Internal Medicine

## 2021-09-21 ENCOUNTER — Ambulatory Visit (INDEPENDENT_AMBULATORY_CARE_PROVIDER_SITE_OTHER): Payer: 59 | Admitting: Internal Medicine

## 2021-09-21 VITALS — BP 120/82 | HR 70 | Temp 98.1°F | Ht 66.0 in | Wt 143.0 lb

## 2021-09-21 DIAGNOSIS — Z0000 Encounter for general adult medical examination without abnormal findings: Secondary | ICD-10-CM | POA: Diagnosis not present

## 2021-09-21 DIAGNOSIS — Z170 Estrogen receptor positive status [ER+]: Secondary | ICD-10-CM

## 2021-09-21 DIAGNOSIS — C50412 Malignant neoplasm of upper-outer quadrant of left female breast: Secondary | ICD-10-CM | POA: Diagnosis not present

## 2021-09-21 DIAGNOSIS — Z85828 Personal history of other malignant neoplasm of skin: Secondary | ICD-10-CM | POA: Diagnosis not present

## 2021-09-21 NOTE — Progress Notes (Signed)
   Subjective:   Patient ID: Shelly [name], female    DOB: 1970-12-17, 51 y.o.   MRN: [id number]  HPI The patient is a 51 YO female coming in new for physical  PMH, [place], social history reviewed and updated  Review of Systems  Constitutional: Negative.   HENT: Negative.    Eyes: Negative.   Respiratory:  Negative for cough, chest tightness and shortness of breath.   Cardiovascular:  Negative for chest pain, palpitations and leg swelling.  Gastrointestinal:  Negative for abdominal distention, abdominal pain, constipation, diarrhea, nausea and vomiting.  Musculoskeletal: Negative.   Skin: Negative.   Neurological: Negative.   Psychiatric/Behavioral: Negative.      Objective:  Physical Exam Constitutional:      Appearance: She is well-developed.  HENT:     Head: Normocephalic and atraumatic.  Cardiovascular:     Rate and Rhythm: Normal rate and regular rhythm.  Pulmonary:     Effort: Pulmonary effort is normal. No respiratory distress.     Breath sounds: Normal breath sounds. No wheezing or rales.  Abdominal:     General: Bowel sounds are normal. There is no distension.     Palpations: Abdomen is soft.     Tenderness: There is no abdominal tenderness. There is no rebound.  Musculoskeletal:     Cervical back: Normal range of motion.  Skin:    General: Skin is warm and dry.  Neurological:     Mental Status: She is alert and oriented to person, place, and time.     Coordination: Coordination normal.     Vitals:   09/21/21 1306  BP: 120/82  Pulse: 70  Temp: 98.1 F (36.7 C)  TempSrc: Oral  SpO2: 97%  Weight: 143 lb (64.9 kg)  Height: '5\' 6"'$  (1.676 m)    Assessment & Plan:

## 2021-09-24 DIAGNOSIS — Z0000 Encounter for general adult medical examination without abnormal findings: Secondary | ICD-10-CM | POA: Insufficient documentation

## 2021-09-24 NOTE — Assessment & Plan Note (Signed)
Sees derm regularly and uses sunscreen regularly. Monitor for new lesions.

## 2021-09-24 NOTE — Assessment & Plan Note (Signed)
Flu shot yearly. Covid-19 counseled. Shingrix complete. Tetanus getting records. Colonoscopy getting records. Mammogram getting records, pap smear getting records. Counseled about sun safety and mole surveillance. Counseled about the dangers of distracted driving. Given 10 year screening recommendations.

## 2021-09-24 NOTE — Assessment & Plan Note (Signed)
Taking tamoxifen and doing okay. Effexor 75 mg daily has helped with side effects.

## 2021-10-27 ENCOUNTER — Other Ambulatory Visit: Payer: Self-pay | Admitting: Obstetrics

## 2021-10-27 DIAGNOSIS — Z9889 Other specified postprocedural states: Secondary | ICD-10-CM

## 2021-10-31 NOTE — Progress Notes (Signed)
Patient Care Team: Marlow Baars, MD as PCP - General (Obstetrics) Antony Blackbird, MD as Consulting Physician (Radiation Oncology) Serena Croissant, MD as Consulting Physician (Hematology and Oncology) Emelia Loron, MD as Consulting Physician (General Surgery) Glyn Ade, PA-C as Physician Assistant (Dermatology)  DIAGNOSIS: No diagnosis found.  SUMMARY OF ONCOLOGIC HISTORY: Oncology History  Malignant neoplasm of upper-outer quadrant of left breast in female, estrogen receptor positive (HCC)  12/31/2019 Initial Diagnosis   Screening detected left breast mass, 1 o'clock position: 1.3 cm by ultrasound (about 2 cm by mammogram), 1 indeterminate left axillary lymph node. 12/31/2019: Biopsy revealed grade 2 IDC with DCIS, lymph node benign, ER greater than 95%, PR greater than 95%, Ki-67 10%, HER-2 negative ratio 1.18, copy number: 2   01/10/2020 Cancer Staging   Staging form: Breast, AJCC 8th Edition - Clinical: Stage IA (cT1c, cN0, cM0, G2, ER+, PR+, HER2-)   01/17/2020 Surgery   Left lumpectomy Dwain Sarna) [phone]): IDC, grade 2, 2.1cm, with high grade DCIS, carcinoma extending to the lateral and superior margin juncture, 5 right axillary lymph nodes negative for carcinoma.    02/04/2020 Oncotype testing   The Oncotype DX score was 15 predicting a risk of outside the breast recurrence over the next 9 years of 4% if the patient's only systemic therapy is tamoxifen for 5 years.    02/06/2020 Surgery   Re-excision of the lateral margin Dwain Sarna) [phone]): no residual carcinoma.    03/17/2020 - 04/07/2020 Radiation Therapy   The patient initially received a dose of 42.72 Gy in 16 fractions to the breast using whole-breast tangent fields. This was delivered using a 3-D conformal technique. The total dose was 42.72 Gy.   05/2020 -  Anti-estrogen oral therapy   Tamoxifen     CHIEF COMPLIANT: Follow-up of left breast cancer on Tamoxifen   INTERVAL HISTORY: Shelly  Bruton [name] is a  51 y.o. with above-mentioned history of left breast cancer who underwent a left lumpectomy followed by re-excision, radiation, currently on antiestrogen therapy with tamoxifen. She presents to the clinic today for follow-up.   ALLERGIES:  has No Known Allergies.  MEDICATIONS:  Current Outpatient Medications  Medication Sig Dispense Refill   guaiFENesin (MUCINEX) 600 MG 12 hr tablet Take by mouth 2 (two) times daily.     ibuprofen (ADVIL,MOTRIN) 200 MG tablet Take 200 mg by mouth every 6 (six) hours as needed.     tamoxifen (NOLVADEX) 10 MG tablet Take 10 mg by mouth daily.     triamcinolone cream (KENALOG) 0.1 % 1 application Externally Twice a day for 14 days     venlafaxine XR (EFFEXOR-XR) 75 MG 24 hr capsule Take 1 capsule (75 mg total) by mouth daily with breakfast. 90 capsule 3   zolpidem (AMBIEN) 5 MG tablet TAKE 1 TABLET BY MOUTH EVERYDAY AT BEDTIME     No current facility-administered medications for this visit.    PHYSICAL EXAMINATION: ECOG PERFORMANCE STATUS: {CHL ONC ECOG PS:[phone]}  There were no vitals filed for this visit. There were no vitals filed for this visit.  BREAST:*** No palpable masses or nodules in either right or left breasts. No palpable axillary supraclavicular or infraclavicular adenopathy no breast tenderness or nipple discharge. (exam performed in the presence of a chaperone)  LABORATORY DATA:  I have reviewed the data as listed    Latest Ref Rng & Units 05/21/2012    3:01 PM 01/02/2012    6:00 AM  CMP  Glucose 70 - 99 mg/dL 83  93  BUN 6 - 23 mg/dL 14  10   Creatinine 0.50 - 1.10 mg/dL 0.80  0.66   Sodium 135 - 145 mEq/L 143  138   Potassium 3.5 - 5.1 mEq/L 3.8  3.6   Chloride 96 - 112 mEq/L 102  103   CO2 19 - 32 mEq/L  28   Calcium 8.4 - 10.5 mg/dL  8.9   Total Protein 6.0 - 8.3 g/dL  7.0   Total Bilirubin 0.3 - 1.2 mg/dL  0.3   Alkaline Phos 39 - 117 U/L  46   AST 0 - 37 U/L  17   ALT 0 - 35 U/L  9     Lab  Results  Component Value Date   WBC 5.7 12/28/2011   HGB 13.0 09/03/2012   HCT 41.0 05/21/2012   MCV 89.1 12/28/2011   PLT 203 12/28/2011    ASSESSMENT & PLAN:  No problem-specific Assessment & Plan notes found for this encounter.    No orders of the defined types were placed in this encounter.  The patient has a good understanding of the overall plan. she agrees with it. she will call with any problems that may develop before the next visit here. Total time spent: 30 mins including face to face time and time spent for planning, charting and co-ordination of care   Suzzette Righter, Agenda 10/31/21    I Gardiner Coins am scribing for Dr. Lindi Adie  ***

## 2021-11-03 ENCOUNTER — Other Ambulatory Visit: Payer: Self-pay

## 2021-11-03 ENCOUNTER — Inpatient Hospital Stay: Payer: 59 | Attending: Hematology and Oncology | Admitting: Hematology and Oncology

## 2021-11-03 DIAGNOSIS — Z79810 Long term (current) use of selective estrogen receptor modulators (SERMs): Secondary | ICD-10-CM | POA: Insufficient documentation

## 2021-11-03 DIAGNOSIS — C50412 Malignant neoplasm of upper-outer quadrant of left female breast: Secondary | ICD-10-CM | POA: Insufficient documentation

## 2021-11-03 DIAGNOSIS — Z170 Estrogen receptor positive status [ER+]: Secondary | ICD-10-CM | POA: Insufficient documentation

## 2021-11-03 MED ORDER — TAMOXIFEN CITRATE 10 MG PO TABS: 10.0000 mg | ORAL_TABLET | Freq: Every day | ORAL | 3 refills | Status: DC

## 2021-11-03 MED ORDER — VENLAFAXINE HCL ER 37.5 MG PO CP24: 37.5000 mg | ORAL_CAPSULE | Freq: Every day | ORAL | 3 refills | Status: DC

## 2021-11-03 NOTE — Assessment & Plan Note (Signed)
Screening detected left breast mass, 1 o'clock position: 1.3 cm by ultrasound (about 2 cm by mammogram), 1 indeterminate left axillary lymph node. 12/31/2019: Biopsy revealed grade 2 IDC with DCIS, lymph node benign, ER greater than 95%, PR greater than 95%, Ki-67 10%, HER-2 negative ratio 1.18, copy number: 2  Recommendations: 1.01/17/20:Left lumpectomy Donne Hazel): IDC, grade 2, 2.1cm, with high grade DCIS, carcinoma extending to the lateral and superior margin juncture, 5 right axillary lymph nodes negative for carcinoma. 2. Oncotype DX: 15, ROR 4% 3. Adjuvant radiation therapystarted 03/18/20 4. Adjuvant antiestrogen therapy ------------------------------------------------------------------------------------------------------------- Treatment Plan:Tamoxifen 20 mgdaily started 05/01/2020  Reason for his changing from anastrozole to tamoxifen was that she had [place] level of 24 on 04/22/2020 and surprisingly it went up to 39 on 04/29/2020. (She had a hysterectomy previously) Recent [place] at her gynecologist: 30 Therefore she is not in menopause by lab criteria.  Tamoxifen toxicities: Severe hot flashes emotional changes mental cloudiness I recommended that she start Effexor 37.5 mg daily she is willing to try it. I sent the prescription.  Breast cancer surveillance:  1.  Mammogram scheduled for 12/08/2021 2. breast exam 11/03/2021: Benign Return to clinic in 1 year for follow-up

## 2021-12-06 NOTE — Progress Notes (Unsigned)
Shelly [name] Phone: [phone] Subjective:   Shelly [name], am serving as a scribe for Dr. Hulan [name].  I'm seeing this patient by the request  of:  Shelly Koch, MD  CC: Neck and shoulder pain  VOH:[id number]  Shelly [name] is a 51 y.o. female coming in with complaint of neck and shoulder pain. Last seen in 2019 for elbow pain. 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 For one week has been having L sided trap pain and anterior L shoulder pain. Has been able to golf without pain. Denies any radiating symptoms.      Past Medical History:  Diagnosis Date   Cancer ([place]) 01/2020   left breast IDC with DCIS   Fibroid 01/2012   Robotic TLH uterus 700g   History of radiation therapy 03/18/20-04/07/20   Left breast- Adjuvant radiation,- Dr. Sondra [name]    [name] pertinent past medical history    Nodular basal cell carcinoma (BCC) 07/01/2020   Right Ala Nasi   Personal history of radiation therapy    PONV (postoperative nausea and vomiting)    Past Surgical History:  Procedure Laterality Date   ABDOMINAL HYSTERECTOMY     BREAST BIOPSY Left 12/31/2019   BREAST LUMPECTOMY Left 01/17/2020   BREAST LUMPECTOMY WITH RADIOACTIVE SEED AND SENTINEL LYMPH NODE BIOPSY Left 01/17/2020   Procedure: LEFT BREAST LUMPECTOMY WITH RADIOACTIVE SEED AND LEFT AXILLARY SENTINEL LYMPH NODE BIOPSY;  Surgeon: Rolm Bookbinder, MD;  Location: [facility];  Service: General;  Laterality: Left;  PECTORAL BLOCK   CYSTOSCOPY  01/02/2012   Procedure: CYSTOSCOPY;  Surgeon: Peri Maris, MD;  Location: [facility];  Service: Gynecology;  Laterality: N/A;   DILATION AND CURETTAGE OF UTERUS     HYSTEROSCOPY     OPEN REDUCTION INTERNAL FIXATION (ORIF) DISTAL RADIAL FRACTURE Left 03/27/2016   Procedure: OPEN REDUCTION INTERNAL FIXATION (ORIF) DISTAL RADIAL FRACTURE;  Surgeon:  Iran Planas, MD;  Location: [facility];  Service: Orthopedics;  Laterality: Left;   RE-EXCISION OF BREAST LUMPECTOMY Left 02/06/2020   Procedure: RE-EXCISION OF LEFT BREAST LUMPECTOMY;  Surgeon: Rolm Bookbinder, MD;  Location: [facility];  Service: General;  Laterality: Left;   ROBOTIC ASSISTED TOTAL HYSTERECTOMY  01/02/2012   Procedure: ROBOTIC ASSISTED TOTAL HYSTERECTOMY;  Surgeon: Peri Maris, MD;  Location: [facility];  Service: Gynecology;  Laterality: N/A;  Roboitc Bilateral Salpingectomy    TONSILLECTOMY     uterine ablation     Social History   Socioeconomic History   Marital status: Married    Spouse name: Not on file   Number of children: Not on file   Years of education: Not on file   Highest education level: Not on file  Occupational History   Not on file  Tobacco Use   Smoking status: Former    Types: Cigarettes    Quit date: 02/01/1996    Years since quitting: 25.8   Smokeless tobacco: Never  Vaping Use   Vaping Use: Never used  Substance and Sexual Activity   Alcohol use: Not Currently   Drug use: [name]   Sexual activity: Yes    Partners: Female    Birth control/protection: None  Other Topics Concern   Not on file  Social History Narrative   Not on file   Social Determinants of Health   Financial Resource Strain: Not on file  Food  Insecurity: Not on file  Transportation Needs: Not on file  Physical Activity: Not on file  Stress: Not on file  Social Connections: Not on file   [name] Known Allergies Family History  Problem Relation Age of Onset   Thyroid disease Mother    Breast cancer Mother 46       breast cancer      Current Outpatient Medications (Respiratory):    guaiFENesin (MUCINEX) 600 MG 12 hr tablet, Take by mouth 2 (two) times daily.   RYALTRIS 665-25 MCG/ACT SUSP, Place into both nostrils.  Current Outpatient Medications (Analgesics):    meloxicam (MOBIC) 15 MG tablet, Take 1 tablet (15 mg total) by mouth daily.   ibuprofen  (ADVIL,MOTRIN) 200 MG tablet, Take 200 mg by mouth every 6 (six) hours as needed.   Current Outpatient Medications (Other):    tiZANidine (ZANAFLEX) 4 MG tablet, Take 1 tablet (4 mg total) by mouth at bedtime.   tamoxifen (NOLVADEX) 10 MG tablet, Take 1 tablet (10 mg total) by mouth daily.   triamcinolone cream (KENALOG) 0.1 %, 1 application Externally Twice a day for 14 days   venlafaxine XR (EFFEXOR-XR) 37.5 MG 24 hr capsule, Take 1 capsule (37.5 mg total) by mouth daily with breakfast.   zolpidem (AMBIEN) 5 MG tablet, TAKE 1 TABLET BY MOUTH EVERYDAY AT BEDTIME   Reviewed prior external information including notes and imaging from  primary care provider As well as notes that were available from care everywhere and other healthcare systems.  Past medical history, social, surgical and family history all reviewed in electronic medical record.  [name] pertanent information unless stated regarding to the chief complaint.   Review of Systems:  [name] headache, visual changes, nausea, vomiting, diarrhea, constipation, dizziness, abdominal pain, skin rash, fevers, chills, night sweats, weight loss, swollen lymph nodes, body aches, joint swelling, chest pain, shortness of breath, mood changes. POSITIVE muscle aches  Objective  Blood pressure (!) 122/90, pulse 66, height '5\' 6"'$  (1.676 m), last menstrual period 12/28/2011, SpO2 99 %.   General: [name] apparent distress alert and oriented x3 mood and affect normal, dressed appropriately.  HEENT: Pupils equal, extraocular movements intact  Respiratory: Patient's speak in full sentences and does not appear short of breath  Cardiovascular: [name] lower extremity edema, non tender, [name] erythema  Neck exam does have some mild loss of lordosis.  Some tenderness to palpation in the paraspinal musculature.  Seems to be mostly on the left side of the neck.  R scapula severe tightness noted.    Impression and Recommendations:     The above documentation has been reviewed  and is accurate and complete Lyndal Pulley, DO

## 2021-12-08 ENCOUNTER — Ambulatory Visit: Payer: 59 | Admitting: Family Medicine

## 2021-12-08 ENCOUNTER — Ambulatory Visit
Admission: RE | Admit: 2021-12-08 | Discharge: 2021-12-08 | Disposition: A | Discharge status: Home / Self Care | Payer: 59 | Source: Ambulatory Visit | Attending: Obstetrics | Admitting: Obstetrics

## 2021-12-08 VITALS — BP 122/90 | HR 66 | Ht 66.0 in

## 2021-12-08 DIAGNOSIS — S161XXA Strain of muscle, fascia and tendon at neck level, initial encounter: Secondary | ICD-10-CM | POA: Diagnosis not present

## 2021-12-08 DIAGNOSIS — Z9889 Other specified postprocedural states: Secondary | ICD-10-CM

## 2021-12-08 MED ORDER — MELOXICAM 15 MG PO TABS: 15.0000 mg | ORAL_TABLET | Freq: Every day | ORAL | 0 refills | Status: DC

## 2021-12-08 MED ORDER — TIZANIDINE HCL 4 MG PO TABS: 4.0000 mg | ORAL_TABLET | Freq: Every day | ORAL | 0 refills | Status: DC

## 2021-12-08 NOTE — Assessment & Plan Note (Signed)
Patient seems to have more of a strain noted.  Discussed with patient about this.  We discussed posture and ergonomics.  Zanaflex given and anti-inflammatories I think will be beneficial.  Patient has no radicular symptoms.  Increase activity slowly otherwise.  Follow-up again in 6 to 8 weeks

## 2021-12-08 NOTE — Patient Instructions (Addendum)
Good to see you Meloxicam '15mg'$  Zanaflex '4mg'$  at night Keep head down when you are beating [place] at golf See me in 4 weeks

## 2021-12-30 ENCOUNTER — Other Ambulatory Visit: Payer: Self-pay | Admitting: Family Medicine

## 2022-01-06 NOTE — Progress Notes (Deleted)
Shelly [name] Phone: [phone] Subjective:    I'm seeing this patient by the request  of:  Hoyt Koch, MD  CC:   [id number]  12/08/2021 Patient seems to have more of a strain noted.  Discussed with patient about this.  We discussed posture and ergonomics.  Zanaflex given and anti-inflammatories I think will be beneficial.  Patient has no radicular symptoms.  Increase activity slowly otherwise.  Follow-up again in 6 to 8 weeks     Update 01/12/2022 Shelly [name] is a 51 y.o. female coming in with complaint of neck pain. Patient states       Past Medical History:  Diagnosis Date   Cancer ([place]) 01/2020   left breast IDC with DCIS   Fibroid 01/2012   Robotic TLH uterus 700g   History of radiation therapy 03/18/20-04/07/20   Left breast- Adjuvant radiation,- Dr. Sondra Come    No pertinent past medical history    Nodular basal cell carcinoma (BCC) 07/01/2020   Right Ala Nasi   Personal history of radiation therapy    PONV (postoperative nausea and vomiting)    Past Surgical History:  Procedure Laterality Date   ABDOMINAL HYSTERECTOMY     BREAST BIOPSY Left 12/31/2019   BREAST LUMPECTOMY Left 01/17/2020   BREAST LUMPECTOMY WITH RADIOACTIVE SEED AND SENTINEL LYMPH NODE BIOPSY Left 01/17/2020   Procedure: LEFT BREAST LUMPECTOMY WITH RADIOACTIVE SEED AND LEFT AXILLARY SENTINEL LYMPH NODE BIOPSY;  Surgeon: Rolm Bookbinder, MD;  Location: [facility];  Service: General;  Laterality: Left;  PECTORAL BLOCK   CYSTOSCOPY  01/02/2012   Procedure: CYSTOSCOPY;  Surgeon: Peri Maris, MD;  Location: [facility];  Service: Gynecology;  Laterality: N/A;   DILATION AND CURETTAGE OF UTERUS     HYSTEROSCOPY     OPEN REDUCTION INTERNAL FIXATION (ORIF) DISTAL RADIAL FRACTURE Left 03/27/2016   Procedure: OPEN REDUCTION INTERNAL FIXATION (ORIF) DISTAL RADIAL FRACTURE;  Surgeon: Iran Planas, MD;   Location: [facility];  Service: Orthopedics;  Laterality: Left;   RE-EXCISION OF BREAST LUMPECTOMY Left 02/06/2020   Procedure: RE-EXCISION OF LEFT BREAST LUMPECTOMY;  Surgeon: Rolm Bookbinder, MD;  Location: [facility];  Service: General;  Laterality: Left;   ROBOTIC ASSISTED TOTAL HYSTERECTOMY  01/02/2012   Procedure: ROBOTIC ASSISTED TOTAL HYSTERECTOMY;  Surgeon: Peri Maris, MD;  Location: [facility];  Service: Gynecology;  Laterality: N/A;  Roboitc Bilateral Salpingectomy    TONSILLECTOMY     uterine ablation     Social History   Socioeconomic History   Marital status: Married    Spouse name: Not on file   Number of children: Not on file   Years of education: Not on file   Highest education level: Not on file  Occupational History   Not on file  Tobacco Use   Smoking status: Former    Types: Cigarettes    Quit date: 02/01/1996    Years since quitting: 25.9   Smokeless tobacco: Never  Vaping Use   Vaping Use: Never used  Substance and Sexual Activity   Alcohol use: Not Currently   Drug use: No   Sexual activity: Yes    Partners: Female    Birth control/protection: None  Other Topics Concern   Not on file  Social History Narrative   Not on file   Social Determinants of Health   Financial Resource Strain: Not on file  Food Insecurity: Not on file  Transportation  Needs: Not on file  Physical Activity: Not on file  Stress: Not on file  Social Connections: Not on file   No Known Allergies Family History  Problem Relation Age of Onset   Thyroid disease Mother    Breast cancer Mother 57       breast cancer      Current Outpatient Medications (Respiratory):    guaiFENesin (MUCINEX) 600 MG 12 hr tablet, Take by mouth 2 (two) times daily.   RYALTRIS 665-25 MCG/ACT SUSP, Place into both nostrils.  Current Outpatient Medications (Analgesics):    ibuprofen (ADVIL,MOTRIN) 200 MG tablet, Take 200 mg by mouth every 6 (six) hours as needed.   meloxicam  (MOBIC) 15 MG tablet, Take 1 tablet (15 mg total) by mouth daily.   Current Outpatient Medications (Other):    tamoxifen (NOLVADEX) 10 MG tablet, Take 1 tablet (10 mg total) by mouth daily.   tiZANidine (ZANAFLEX) 4 MG tablet, TAKE 1 TABLET BY MOUTH AT BEDTIME.   triamcinolone cream (KENALOG) 0.1 %, 1 application Externally Twice a day for 14 days   venlafaxine XR (EFFEXOR-XR) 37.5 MG 24 hr capsule, Take 1 capsule (37.5 mg total) by mouth daily with breakfast.   zolpidem (AMBIEN) 5 MG tablet, TAKE 1 TABLET BY MOUTH EVERYDAY AT BEDTIME   Reviewed prior external information including notes and imaging from  primary care provider As well as notes that were available from care everywhere and other healthcare systems.  Past medical history, social, surgical and family history all reviewed in electronic medical record.  No pertanent information unless stated regarding to the chief complaint.   Review of Systems:  No headache, visual changes, nausea, vomiting, diarrhea, constipation, dizziness, abdominal pain, skin rash, fevers, chills, night sweats, weight loss, swollen lymph nodes, body aches, joint swelling, chest pain, shortness of breath, mood changes. POSITIVE muscle aches  Objective  Last menstrual period 12/28/2011.   General: No apparent distress alert and oriented x3 mood and affect normal, dressed appropriately.  HEENT: Pupils equal, extraocular movements intact  Respiratory: Patient's speak in full sentences and does not appear short of breath  Cardiovascular: No lower extremity edema, non tender, no erythema      Impression and Recommendations:

## 2022-01-10 ENCOUNTER — Encounter: Payer: Self-pay | Admitting: Internal Medicine

## 2022-01-11 ENCOUNTER — Telehealth (INDEPENDENT_AMBULATORY_CARE_PROVIDER_SITE_OTHER): Payer: 59 | Admitting: Internal Medicine

## 2022-01-11 ENCOUNTER — Encounter: Payer: Self-pay | Admitting: Internal Medicine

## 2022-01-11 DIAGNOSIS — U071 COVID-19: Secondary | ICD-10-CM

## 2022-01-11 MED ORDER — MOLNUPIRAVIR EUA 200MG CAPSULE: 4.0000 | ORAL_CAPSULE | Freq: Two times a day (BID) | ORAL | 0 refills | Status: AC

## 2022-01-11 NOTE — Assessment & Plan Note (Signed)
Rx molnupiravir and given cdc guidelines on quarantine. Counseled about otc meds to help with symptoms.

## 2022-01-11 NOTE — Progress Notes (Signed)
Virtual Visit via Video Note  I connected with Shelly [name] on 01/11/22 at 11:00 AM EST by a video enabled telemedicine application and verified that I am speaking with the correct person using two identifiers.  The patient and the provider were at separate locations throughout the entire encounter. Patient location: home, Provider location: work   I discussed the limitations of evaluation and management by telemedicine and the availability of in person appointments. The patient expressed understanding and agreed to proceed. The patient and the provider were the only parties present for the visit unless noted in HPI below.  History of Present Illness: The patient is a 51 y.o. female with visit for covid-19. Started 2 days ago  Observations/Objective: Appearance: normal, breathing appears normal, casual grooming  Assessment and Plan: See problem oriented charting  Follow Up Instructions: rx molnupiravir, counseled about cdc quarantine guidelines  I discussed the assessment and treatment plan with the patient. The patient was provided an opportunity to ask questions and all were answered. The patient agreed with the plan and demonstrated an understanding of the instructions.   The patient was advised to call back or seek an in-person evaluation if the symptoms worsen or if the condition fails to improve as anticipated.  Hoyt Koch, MD

## 2022-01-12 ENCOUNTER — Ambulatory Visit: Payer: 59 | Admitting: Family Medicine

## 2022-01-14 NOTE — Progress Notes (Deleted)
[place] Phone: [phone] Subjective:    I'm seeing this patient by the request  of:  Hoyt Koch, MD  CC:   [id number]  12/08/2021 Patient seems to have more of a strain noted. Discussed with patient about this. We discussed posture and ergonomics. Zanaflex given and anti-inflammatories I think will be beneficial. Patient has no radicular symptoms. Increase activity slowly otherwise. Follow-up again in 6 to 8 weeks   Update 01/19/2022 Shelly [name] is a 51 y.o. female coming in with complaint of neck pain. 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 Sondra Come    No pertinent past medical history    Nodular basal cell carcinoma (BCC) 07/01/2020   Right Ala Nasi   Personal history of radiation therapy    PONV (postoperative nausea and vomiting)    Past Surgical History:  Procedure Laterality Date   ABDOMINAL HYSTERECTOMY     BREAST BIOPSY Left 12/31/2019   BREAST LUMPECTOMY Left 01/17/2020   BREAST LUMPECTOMY WITH RADIOACTIVE SEED AND SENTINEL LYMPH NODE BIOPSY Left 01/17/2020   Procedure: LEFT BREAST LUMPECTOMY WITH RADIOACTIVE SEED AND LEFT AXILLARY SENTINEL LYMPH NODE BIOPSY;  Surgeon: Rolm Bookbinder, MD;  Location: [facility];  Service: General;  Laterality: Left;  PECTORAL BLOCK   CYSTOSCOPY  01/02/2012   Procedure: CYSTOSCOPY;  Surgeon: Peri Maris, MD;  Location: [facility];  Service: Gynecology;  Laterality: N/A;   DILATION AND CURETTAGE OF UTERUS     HYSTEROSCOPY     OPEN REDUCTION INTERNAL FIXATION (ORIF) DISTAL RADIAL FRACTURE Left 03/27/2016   Procedure: OPEN REDUCTION INTERNAL FIXATION (ORIF) DISTAL RADIAL FRACTURE;  Surgeon: Iran Planas, MD;   Location: [facility];  Service: Orthopedics;  Laterality: Left;   RE-EXCISION OF BREAST LUMPECTOMY Left 02/06/2020   Procedure: RE-EXCISION OF LEFT BREAST LUMPECTOMY;  Surgeon: Rolm Bookbinder, MD;  Location: [place];  Service: General;  Laterality: Left;   ROBOTIC ASSISTED TOTAL HYSTERECTOMY  01/02/2012   Procedure: ROBOTIC ASSISTED TOTAL HYSTERECTOMY;  Surgeon: Peri Maris, MD;  Location: [facility];  Service: Gynecology;  Laterality: N/A;  Roboitc Bilateral Salpingectomy    TONSILLECTOMY     uterine ablation     Social History   Socioeconomic History   Marital status: Married    Spouse name: Not on file   Number of children: Not on file   Years of education: Not on file   Highest education level: Not on file  Occupational History   Not on file  Tobacco Use   Smoking status: Former    Types: Cigarettes    Quit date: 02/01/1996    Years since quitting: 25.9   Smokeless tobacco: Never  Vaping Use   Vaping Use: Never used  Substance and Sexual Activity   Alcohol use: Not Currently   Drug use: No   Sexual activity: Yes    Partners: Female    Birth control/protection: None  Other Topics Concern   Not on file  Social History Narrative   Not on file   Social Determinants of Health   Financial Resource Strain: Not on file  Food Insecurity: Not on file  Transportation Needs: Not on file  Physical Activity: Not on  file  Stress: Not on file  Social Connections: Not on file   No Known Allergies Family History  Problem Relation Age of Onset   Thyroid disease Mother    Breast cancer Mother 69       breast cancer      Current Outpatient Medications (Respiratory):    guaiFENesin (MUCINEX) 600 MG 12 hr tablet, Take by mouth 2 (two) times daily.   RYALTRIS 665-25 MCG/ACT SUSP, Place into both nostrils.  Current Outpatient Medications (Analgesics):    ibuprofen (ADVIL,MOTRIN) 200 MG tablet, Take 200 mg by mouth every 6 (six) hours as needed.   meloxicam  (MOBIC) 15 MG tablet, Take 1 tablet (15 mg total) by mouth daily.   Current Outpatient Medications (Other):    molnupiravir EUA (LAGEVRIO) 200 mg CAPS capsule, Take 4 capsules (800 mg total) by mouth 2 (two) times daily for 5 days.   tamoxifen (NOLVADEX) 10 MG tablet, Take 1 tablet (10 mg total) by mouth daily.   tiZANidine (ZANAFLEX) 4 MG tablet, TAKE 1 TABLET BY MOUTH AT BEDTIME.   triamcinolone cream (KENALOG) 0.1 %, 1 application Externally Twice a day for 14 days   venlafaxine XR (EFFEXOR-XR) 37.5 MG 24 hr capsule, Take 1 capsule (37.5 mg total) by mouth daily with breakfast.   zolpidem (AMBIEN) 5 MG tablet, TAKE 1 TABLET BY MOUTH EVERYDAY AT BEDTIME   Reviewed prior external information including notes and imaging from  primary care provider As well as notes that were available from care everywhere and other healthcare systems.  Past medical history, social, surgical and family history all reviewed in electronic medical record.  No pertanent information unless stated regarding to the chief complaint.   Review of Systems:  No headache, visual changes, nausea, vomiting, diarrhea, constipation, dizziness, abdominal pain, skin rash, fevers, chills, night sweats, weight loss, swollen lymph nodes, body aches, joint swelling, chest pain, shortness of breath, mood changes. POSITIVE muscle aches  Objective  Last menstrual period 12/28/2011.   General: No apparent distress alert and oriented x3 mood and affect normal, dressed appropriately.  HEENT: Pupils equal, extraocular movements intact  Respiratory: Patient's speak in full sentences and does not appear short of breath  Cardiovascular: No lower extremity edema, non tender, no erythema      Impression and Recommendations:

## 2022-01-19 ENCOUNTER — Ambulatory Visit: Payer: 59 | Admitting: Family Medicine

## 2022-01-20 ENCOUNTER — Encounter: Payer: Self-pay | Admitting: Internal Medicine

## 2022-03-10 ENCOUNTER — Encounter: Payer: Self-pay | Admitting: Internal Medicine

## 2022-03-11 MED ORDER — ZOLPIDEM TARTRATE 5 MG PO TABS: 5.0000 mg | ORAL_TABLET | Freq: Every evening | ORAL | 0 refills | Status: DC | PRN

## 2022-04-08 ENCOUNTER — Encounter: Payer: Self-pay | Admitting: Hematology and Oncology

## 2022-04-08 ENCOUNTER — Ambulatory Visit: Payer: 59 | Admitting: Internal Medicine

## 2022-04-11 ENCOUNTER — Encounter: Payer: Self-pay | Admitting: Internal Medicine

## 2022-04-11 ENCOUNTER — Ambulatory Visit (INDEPENDENT_AMBULATORY_CARE_PROVIDER_SITE_OTHER): Payer: 59 | Admitting: Internal Medicine

## 2022-04-11 VITALS — BP 120/74 | HR 65 | Temp 98.7°F | Ht 66.0 in | Wt 140.0 lb

## 2022-04-11 DIAGNOSIS — J3089 Other allergic rhinitis: Secondary | ICD-10-CM

## 2022-04-11 NOTE — Progress Notes (Signed)
   Subjective:   Patient ID: Shelly [name], female    DOB: 09/05/1970, 52 y.o.   MRN: [id number]  HPI The patient is a 52 YO female coming in for allergies type symptoms  Review of Systems  Constitutional: Negative.   HENT:  Positive for congestion.   Eyes: Negative.   Respiratory:  Negative for cough, chest tightness and shortness of breath.   Cardiovascular:  Negative for chest pain, palpitations and leg swelling.  Gastrointestinal:  Negative for abdominal distention, abdominal pain, constipation, diarrhea, nausea and vomiting.  Musculoskeletal: Negative.   Skin: Negative.   Neurological: Negative.   Psychiatric/Behavioral: Negative.      Objective:  Physical Exam Constitutional:      Appearance: She is well-developed.  HENT:     Head: Normocephalic and atraumatic.  Cardiovascular:     Rate and Rhythm: Normal rate and regular rhythm.  Pulmonary:     Effort: Pulmonary effort is normal. No respiratory distress.     Breath sounds: Normal breath sounds. No wheezing or rales.  Abdominal:     General: Bowel sounds are normal. There is no distension.     Palpations: Abdomen is soft.     Tenderness: There is no abdominal tenderness. There is no rebound.  Musculoskeletal:     Cervical back: Normal range of motion.  Skin:    General: Skin is warm and dry.  Neurological:     Mental Status: She is alert and oriented to person, place, and time.     Coordination: Coordination normal.     Vitals:   04/11/22 1543  BP: 120/74  Pulse: 65  Temp: 98.7 F (37.1 C)  TempSrc: Oral  SpO2: 97%  Weight: 140 lb (63.5 kg)  Height: 5\' 6"  (1.676 m)    Assessment & Plan:  Visit time 15 minutes in face to face communication with patient and coordination of care, additional 5 minutes spent in record review, coordination or care, ordering tests, communicating/referring to other healthcare professionals, documenting in medical records all on the same day of the visit for total time  20 minutes spent on the visit.

## 2022-04-15 ENCOUNTER — Encounter: Payer: Self-pay | Admitting: Internal Medicine

## 2022-04-15 DIAGNOSIS — J309 Allergic rhinitis, unspecified: Secondary | ICD-10-CM | POA: Insufficient documentation

## 2022-04-15 NOTE — Assessment & Plan Note (Signed)
Perhaps mild virus set off symptoms or allergens. Resolving now and advised to use her flonase daily for 1-2 weeks and can use when needed for flares.

## 2022-04-26 ENCOUNTER — Other Ambulatory Visit: Payer: Self-pay | Admitting: Obstetrics and Gynecology

## 2022-04-26 DIAGNOSIS — N6320 Unspecified lump in the left breast, unspecified quadrant: Secondary | ICD-10-CM

## 2022-05-05 ENCOUNTER — Ambulatory Visit
Admission: RE | Admit: 2022-05-05 | Discharge: 2022-05-05 | Disposition: A | Discharge status: Home / Self Care | Payer: 59 | Source: Ambulatory Visit | Attending: Obstetrics and Gynecology | Admitting: Obstetrics and Gynecology

## 2022-05-05 DIAGNOSIS — N6320 Unspecified lump in the left breast, unspecified quadrant: Secondary | ICD-10-CM

## 2022-05-11 ENCOUNTER — Encounter: Payer: Self-pay | Admitting: Family Medicine

## 2022-05-11 ENCOUNTER — Other Ambulatory Visit: Payer: Self-pay

## 2022-05-11 ENCOUNTER — Ambulatory Visit: Payer: 59 | Admitting: Family Medicine

## 2022-05-11 VITALS — BP 118/74 | HR 69 | Ht 66.0 in | Wt 140.0 lb

## 2022-05-11 DIAGNOSIS — G5781 Other specified mononeuropathies of right lower limb: Secondary | ICD-10-CM

## 2022-05-11 DIAGNOSIS — M79671 Pain in right foot: Secondary | ICD-10-CM | POA: Diagnosis not present

## 2022-05-11 DIAGNOSIS — M79672 Pain in left foot: Secondary | ICD-10-CM

## 2022-05-11 MED ORDER — GABAPENTIN 100 MG PO CAPS: 100.0000 mg | ORAL_CAPSULE | Freq: Two times a day (BID) | ORAL | 3 refills | Status: DC

## 2022-05-11 NOTE — Assessment & Plan Note (Addendum)
Right foot neuroma noted.  Patient is very small at this point between the second and third metatarsal heads.  Mild positive squeeze test noted.  Patient also on ultrasound had a very mild bursitis but nothing severe.  Discussed icing regimen and home exercises, discussed proper shoes, discussed the breakdown of the transverse arch.  Worsening pain can consider the possibility of anti-inflammatories on a more regular basis but I think patient will do well.  Follow-up again in 2 months and if worsening symptoms consider injection, gabapentin prescribed

## 2022-05-11 NOTE — Patient Instructions (Signed)
Good to see you  Gabapentin 200mg  nightly Think it is more of a neuroma  Exercises for foot given Recovery sandals in the house Either decrease duration of the hike or decrease weight of the pack  Keep other cardio exercises more non weight bearing See me again in 2 months

## 2022-05-11 NOTE — Progress Notes (Signed)
Tawana ScaleZach Larayah Clute D.O. [facility] [address] [place] [id number] Phone: [phone] Subjective:    I'm seeing this patient by the request  of:  Myrlene Brokerrawford, Elizabeth A, MD  CC: Right foot pain  BJY:[id number]:Subjective  Shelly [name] is a 52 y.o. female coming in with complaint of R foot pain. Patient states that she is having pain over the 2nd metatarsal. Training for backpacking trip, walks 18 holes a lot, does pilates started running recently. Pain over plantar aspect. Uses metatarsal pads in all shoes. Does go barefoot from time to time.        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 Roselind MessierKinard    No pertinent past medical history    Nodular basal cell carcinoma (BCC) 07/01/2020   Right Ala Nasi   Personal history of radiation therapy    PONV (postoperative nausea and vomiting)    Past Surgical History:  Procedure Laterality Date   ABDOMINAL HYSTERECTOMY     BREAST BIOPSY Left 12/31/2019   BREAST LUMPECTOMY Left 01/17/2020   BREAST LUMPECTOMY WITH RADIOACTIVE SEED AND SENTINEL LYMPH NODE BIOPSY Left 01/17/2020   Procedure: LEFT BREAST LUMPECTOMY WITH RADIOACTIVE SEED AND LEFT AXILLARY SENTINEL LYMPH NODE BIOPSY;  Surgeon: Emelia LoronWakefield, Matthew, MD;  Location: [facility];  Service: General;  Laterality: Left;  PECTORAL BLOCK   CYSTOSCOPY  01/02/2012   Procedure: CYSTOSCOPY;  Surgeon: Alison Murrayynthia P [name], MD;  Location: [facility];  Service: Gynecology;  Laterality: N/A;   DILATION AND CURETTAGE OF UTERUS     HYSTEROSCOPY     OPEN REDUCTION INTERNAL FIXATION (ORIF) DISTAL RADIAL FRACTURE Left 03/27/2016   Procedure: OPEN REDUCTION INTERNAL FIXATION (ORIF) DISTAL RADIAL FRACTURE;  Surgeon: Bradly BienenstockFred Ortmann, MD;  Location: [facility];  Service: Orthopedics;  Laterality: Left;    RE-EXCISION OF BREAST LUMPECTOMY Left 02/06/2020   Procedure: RE-EXCISION OF LEFT BREAST LUMPECTOMY;  Surgeon: Emelia LoronWakefield, Matthew, MD;  [facility];  Service: General;  Laterality: Left;   ROBOTIC ASSISTED TOTAL HYSTERECTOMY  01/02/2012   Procedure: ROBOTIC ASSISTED TOTAL HYSTERECTOMY;  Surgeon: Alison Murrayynthia P [name], MD;  Location: [facility];  Service: Gynecology;  Laterality: N/A;  Roboitc Bilateral Salpingectomy    TONSILLECTOMY     uterine ablation     Social History   Socioeconomic History   Marital status: Married    Spouse name: Not on file   Number of children: Not on file   Years of education: Not on file   Highest education level: Not on file  Occupational History   Not on file  Tobacco Use   Smoking status: Former    Types: Cigarettes    Quit date: 02/01/1996    Years since quitting: 26.2   Smokeless tobacco: Never  Vaping Use   Vaping Use: Never used  Substance and Sexual Activity   Alcohol use: Not Currently   Drug use: No   Sexual activity: Yes    Partners: Female    Birth control/protection: None  Other Topics Concern   Not on file  Social History Narrative   Not on file   Social Determinants of Health   Financial Resource Strain: Not on file  Food Insecurity: Not on file  Transportation Needs: Not on file  Physical Activity: Not on file  Stress: Not  on file  Social Connections: Not on file   No Known Allergies Family History  Problem Relation Age of Onset   Thyroid disease Mother    Breast cancer Mother 49       breast cancer      Current Outpatient Medications (Respiratory):    guaiFENesin (MUCINEX) 600 MG 12 hr tablet, Take by mouth 2 (two) times daily.   RYALTRIS 665-25 MCG/ACT SUSP, Place into both nostrils.  Current Outpatient Medications (Analgesics):    ibuprofen (ADVIL,MOTRIN) 200 MG tablet, Take 200 mg by mouth every 6 (six) hours as needed.   meloxicam (MOBIC) 15 MG tablet, Take 1 tablet (15 mg total) by mouth  daily.   Current Outpatient Medications (Other):    cholecalciferol (VITAMIN D3) 25 MCG (1000 UNIT) tablet, Take 1,000 Units by mouth daily.   gabapentin (NEURONTIN) 100 MG capsule, Take 1 capsule (100 mg total) by mouth 2 (two) times daily.   magnesium (MAGTAB) 84 MG ( ) TBCR SR tablet, Take 84 mg by mouth.   tamoxifen (NOLVADEX) 10 MG tablet, Take 1 tablet (10 mg total) by mouth daily.   tiZANidine (ZANAFLEX) 4 MG tablet, TAKE 1 TABLET BY MOUTH AT BEDTIME.   triamcinolone cream (KENALOG) 0.1 %,    venlafaxine XR (EFFEXOR-XR) 37.5 MG 24 hr capsule, Take 1 capsule (37.5 mg total) by mouth daily with breakfast.   vitamin E 200 UNIT capsule, Take 200 Units by mouth daily.   zolpidem (AMBIEN) 5 MG tablet, Take 1 tablet (5 mg total) by mouth at bedtime as needed for sleep.   Reviewed prior external information including notes and imaging from  primary care provider As well as notes that were available from care everywhere and other healthcare systems.  Past medical history, social, surgical and family history all reviewed in electronic medical record.  No pertanent information unless stated regarding to the chief complaint.   Review of Systems:  No headache, visual changes, nausea, vomiting, diarrhea, constipation, dizziness, abdominal pain, skin rash, fevers, chills, night sweats, weight loss, swollen lymph nodes, body aches, joint swelling, chest pain, shortness of breath, mood changes. POSITIVE muscle aches  Objective  Blood pressure 118/74, pulse 69, height 5\' 6"  (1.676 m), weight 140 lb (63.5 kg), last menstrual period 12/28/2011.   General: No apparent distress alert and oriented x3 mood and affect normal, dressed appropriately.  HEENT: Pupils equal, extraocular movements intact  Respiratory: Patient's speak in full sentences and does not appear short of breath  Cardiovascular: No lower extremity edema, non tender, no erythema  Right foot exam shows the patient does have breakdown  of the transverse arch.  Patient is tender to palpation over the heads of the metatarsal.  Mild positive squeeze test noted.  No inflammation noted.  Good range of motion of the ankle and toes  Limited muscular skeletal ultrasound was performed and interpreted by Antoine Primas, M  Ultrasound shows no significant cyst patient does have what appears to be a very small neuroma noted.  Hypoechoic changes between the second and third toes that is consistent with some mild increase in Doppler flow Impression: Mild neuroma    Impression and Recommendations:      The above documentation has been reviewed and is accurate and complete Judi Saa, DO

## 2022-07-15 ENCOUNTER — Ambulatory Visit: Payer: 59 | Admitting: Family Medicine

## 2022-10-05 IMAGING — US US BREAST*L* LIMITED INC AXILLA
1 series · 13 of 14 positions shown · non-contrast
Comparison: Previous exam(s).

CLINICAL DATA: Screening recall for a possible left breast mass.

EXAM:
DIGITAL DIAGNOSTIC UNILATERAL LEFT MAMMOGRAM WITH TOMO AND CAD;
ULTRASOUND LEFT BREAST LIMITED

[Series 1: us breast*left* limited inc axilla · 0.06mm/px · 13 of 14 slices shown]
[im 1/14]
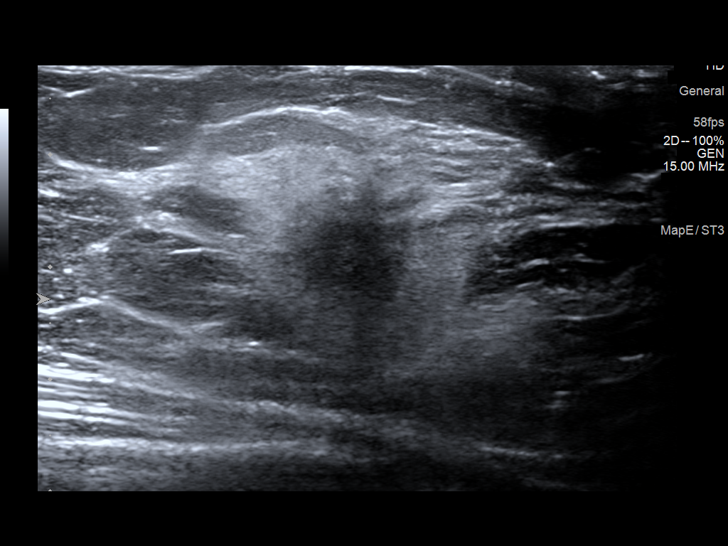
[im 2/14]
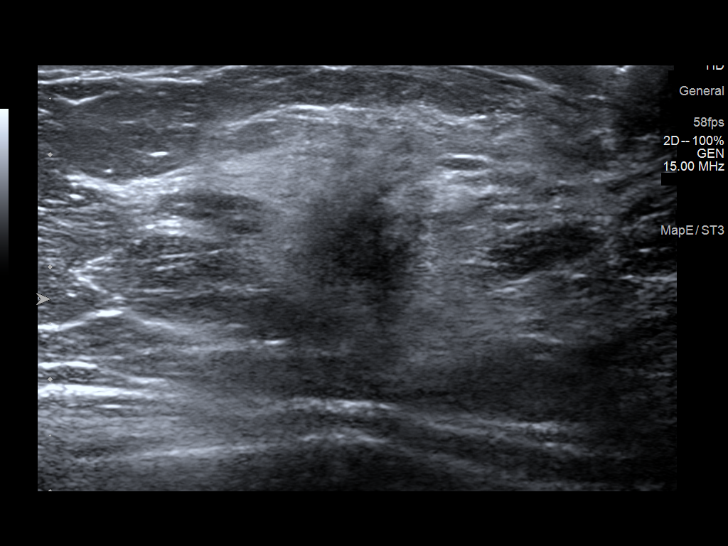
[im 3/14]
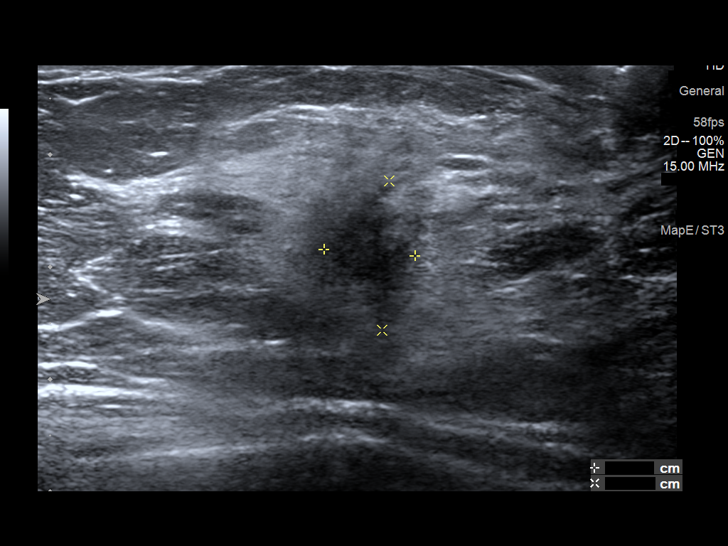
[im 4/14]
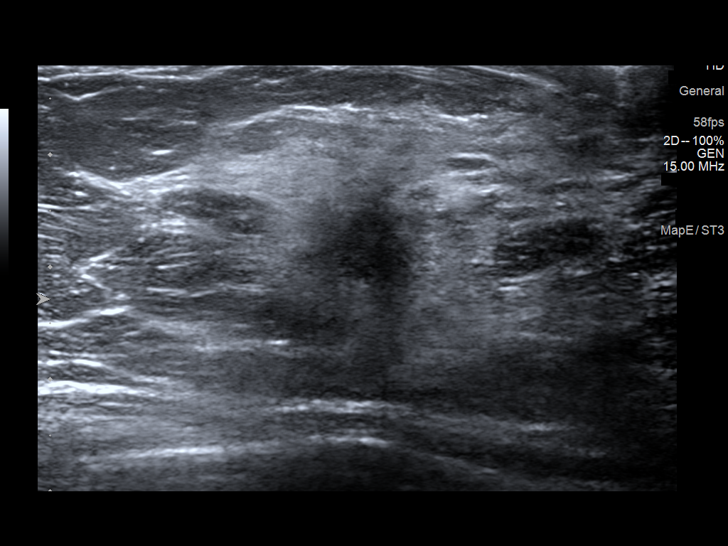
[im 5/14]
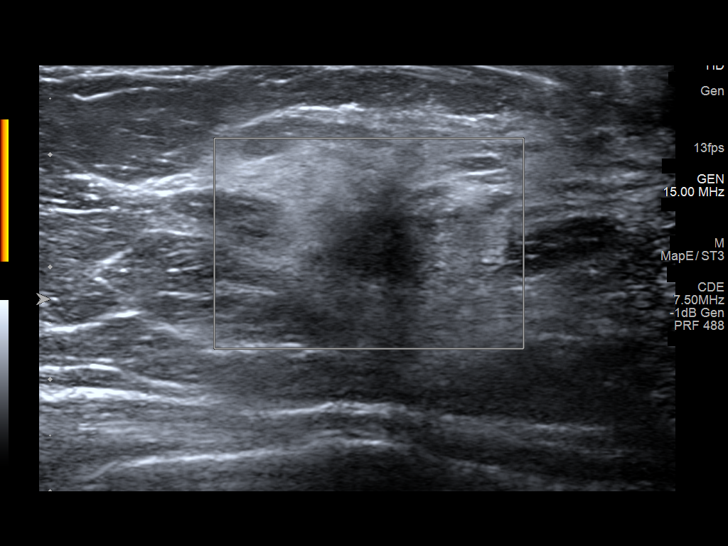
[im 6/14]
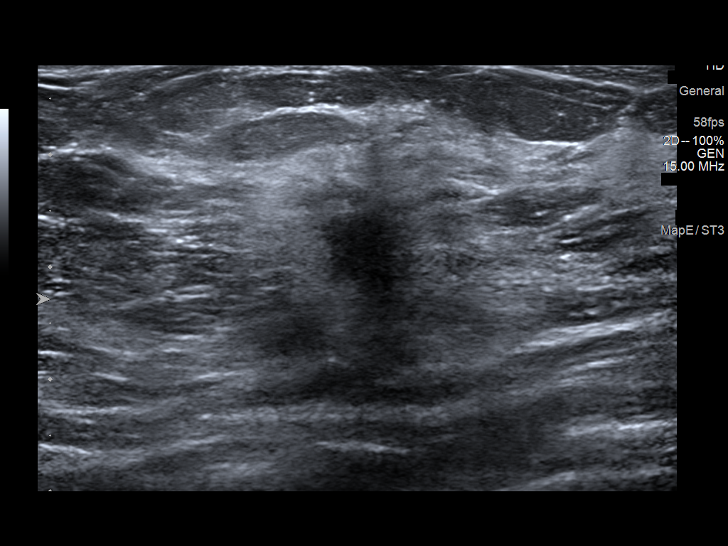
[im 8/14]
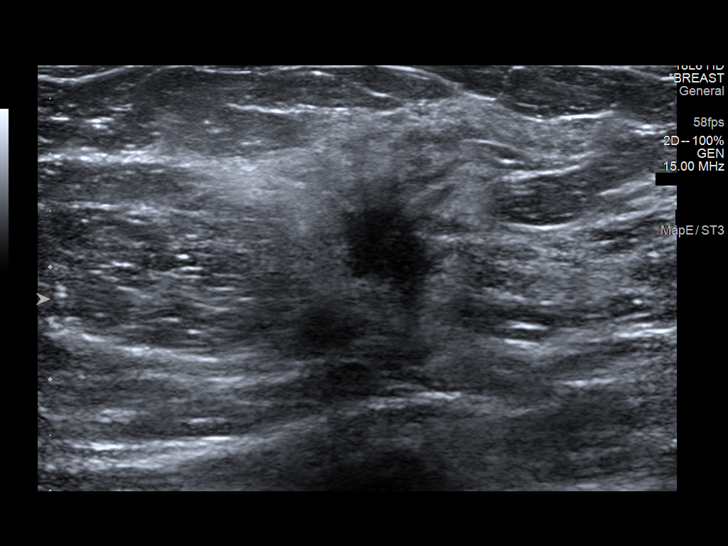
[im 9/14]
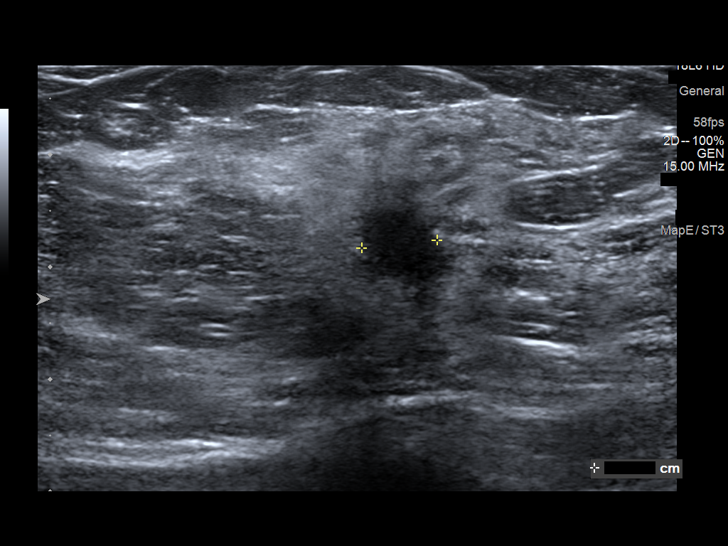
[im 10/14]
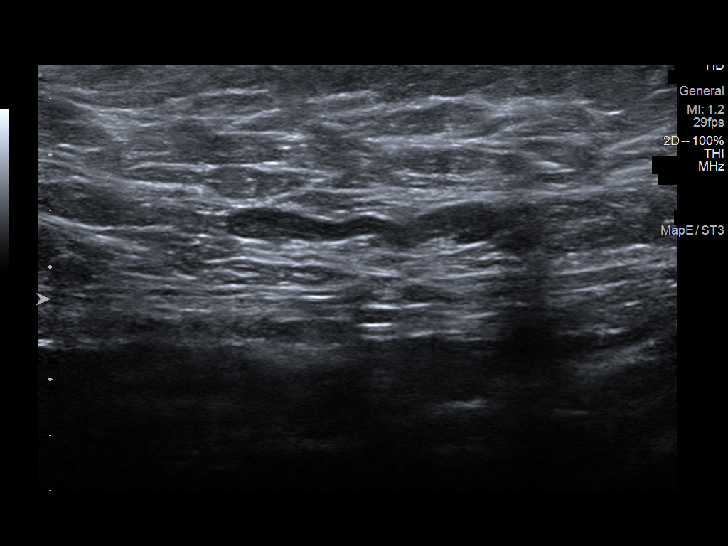
[im 11/14]
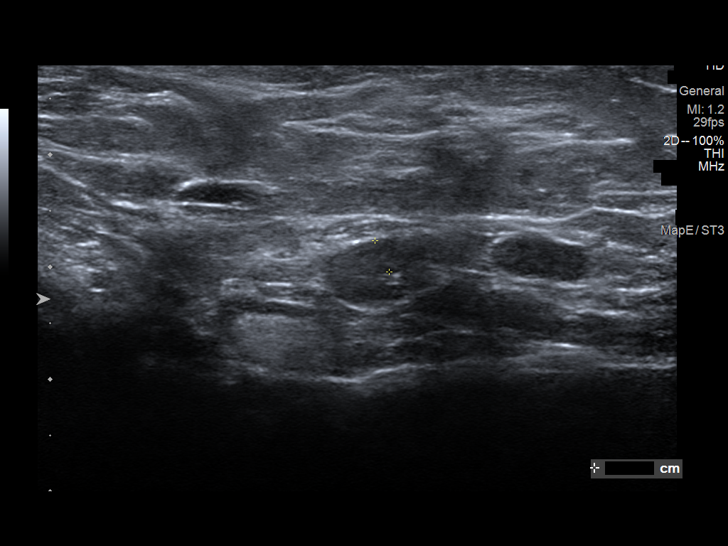
[im 12/14]
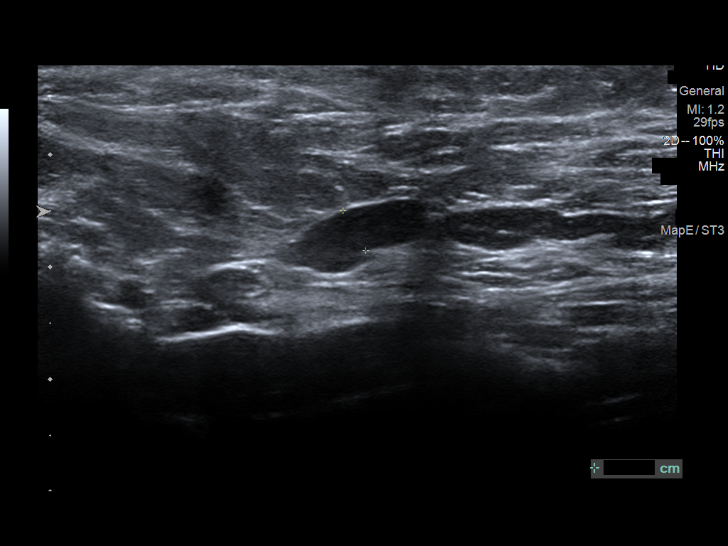
[im 13/14]
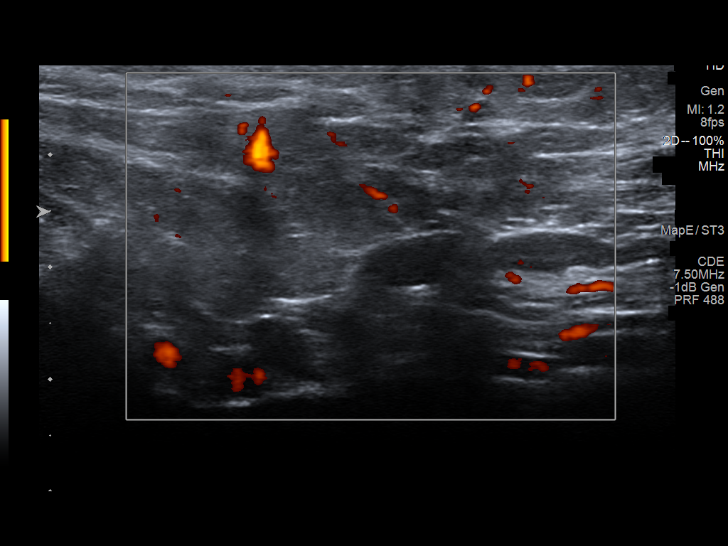
[im 14/14]
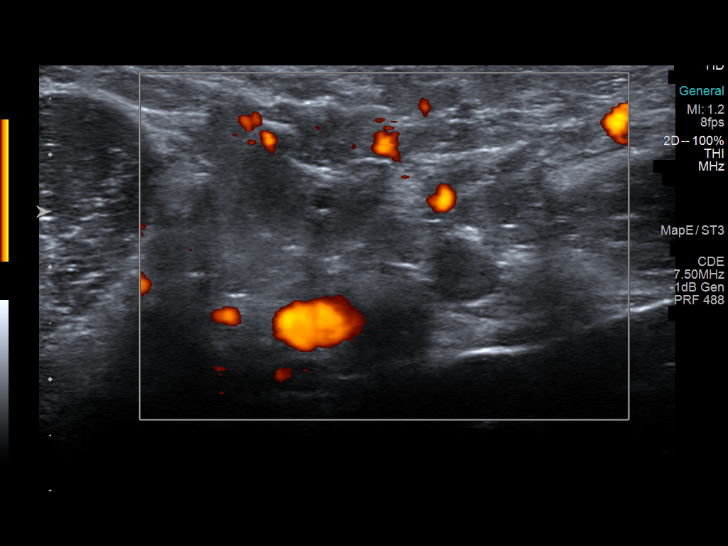

[13 of 14 positions shown; findings below may reference images not displayed]

ACR Breast Density Category c: The breast tissue is heterogeneously
dense, which may obscure small masses.
FINDINGS: Spot compression tomosynthesis images through the upper outer
quadrant of the left breast demonstrate a persistent mass-like area
of distortion spanning approximately 2 cm.

Mammographic images were processed with CAD.

Ultrasound targeted to the left breast at 1 o'clock, 2 cm from the
nipple demonstrates an irregular hypoechoic mass measuring 1.3 x
x 0.8 cm. Ultrasound of the left axilla demonstrates 1 prominent
lymph node with a cortex measuring up to 4 mm.
IMPRESSION: 1. There is a highly suspicious mass in the left breast at 1
o'clock. By ultrasound this measures 1.3 cm, but appears closer to 2
cm mammographically.

2. One indeterminate left axillary lymph node with cortex measuring
up to 4 mm.

RECOMMENDATION:
Ultrasound guided biopsy is recommended for the left breast mass and
for the left axillary lymph node. The procedure has been scheduled
for 12/31/2019 at [DATE] a.m.

I have discussed the findings and recommendations with the patient.
If applicable, a reminder letter will be sent to the patient
regarding the next appointment.

BI-RADS CATEGORY  5: Highly suggestive of malignancy.

## 2022-10-05 IMAGING — MG MM DIGITAL DIAGNOSTIC UNILAT*L* W/ TOMO W/ CAD
4 series · 4 of 12 positions shown · non-contrast
Comparison: Previous exam(s).

CLINICAL DATA: Screening recall for a possible left breast mass.

EXAM:
DIGITAL DIAGNOSTIC UNILATERAL LEFT MAMMOGRAM WITH TOMO AND CAD;
ULTRASOUND LEFT BREAST LIMITED

[L CC synth-2D]
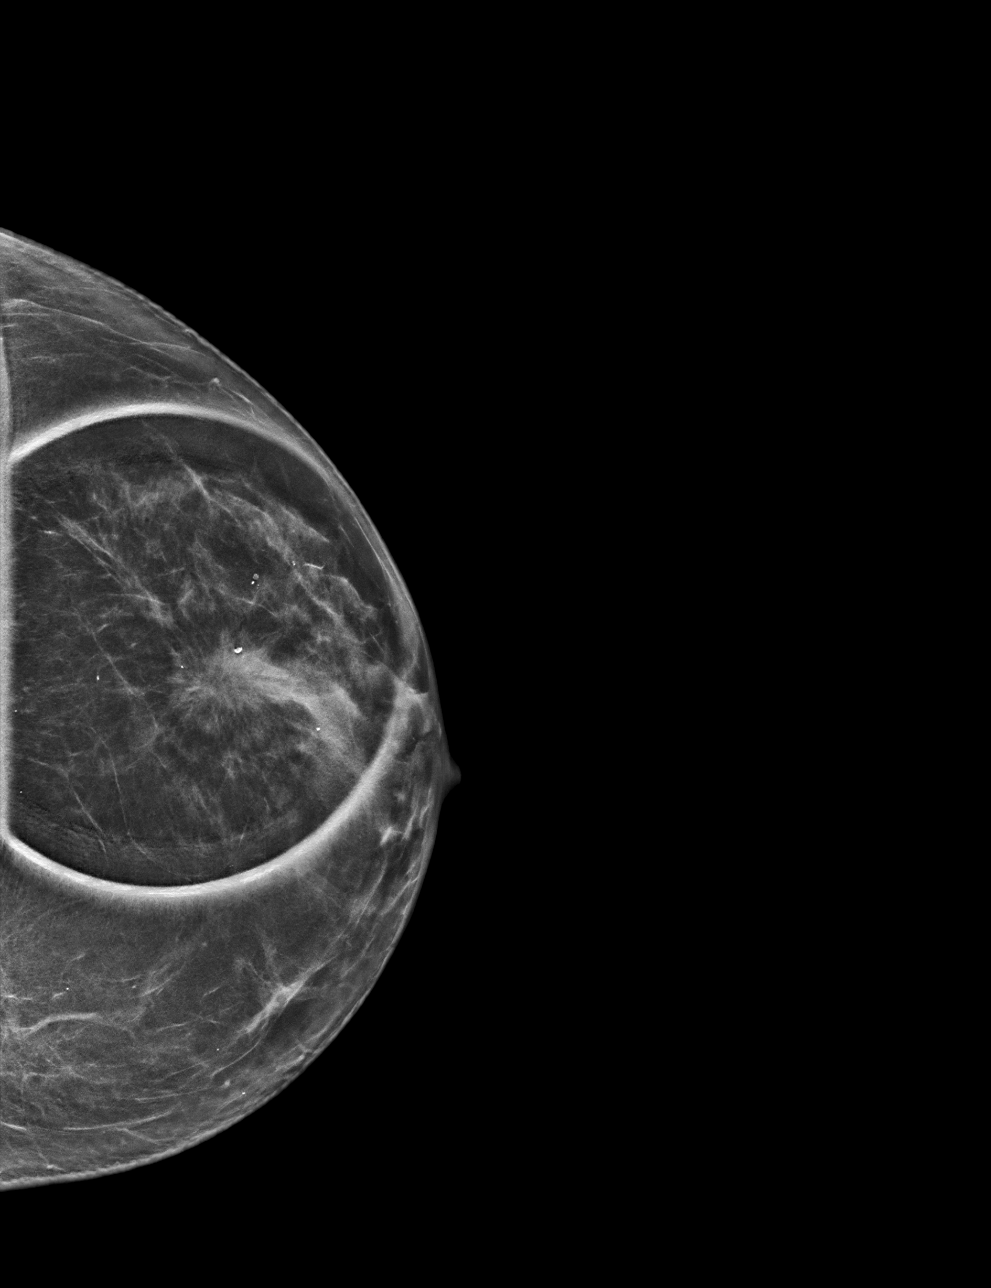

[L MLO synth-2D]
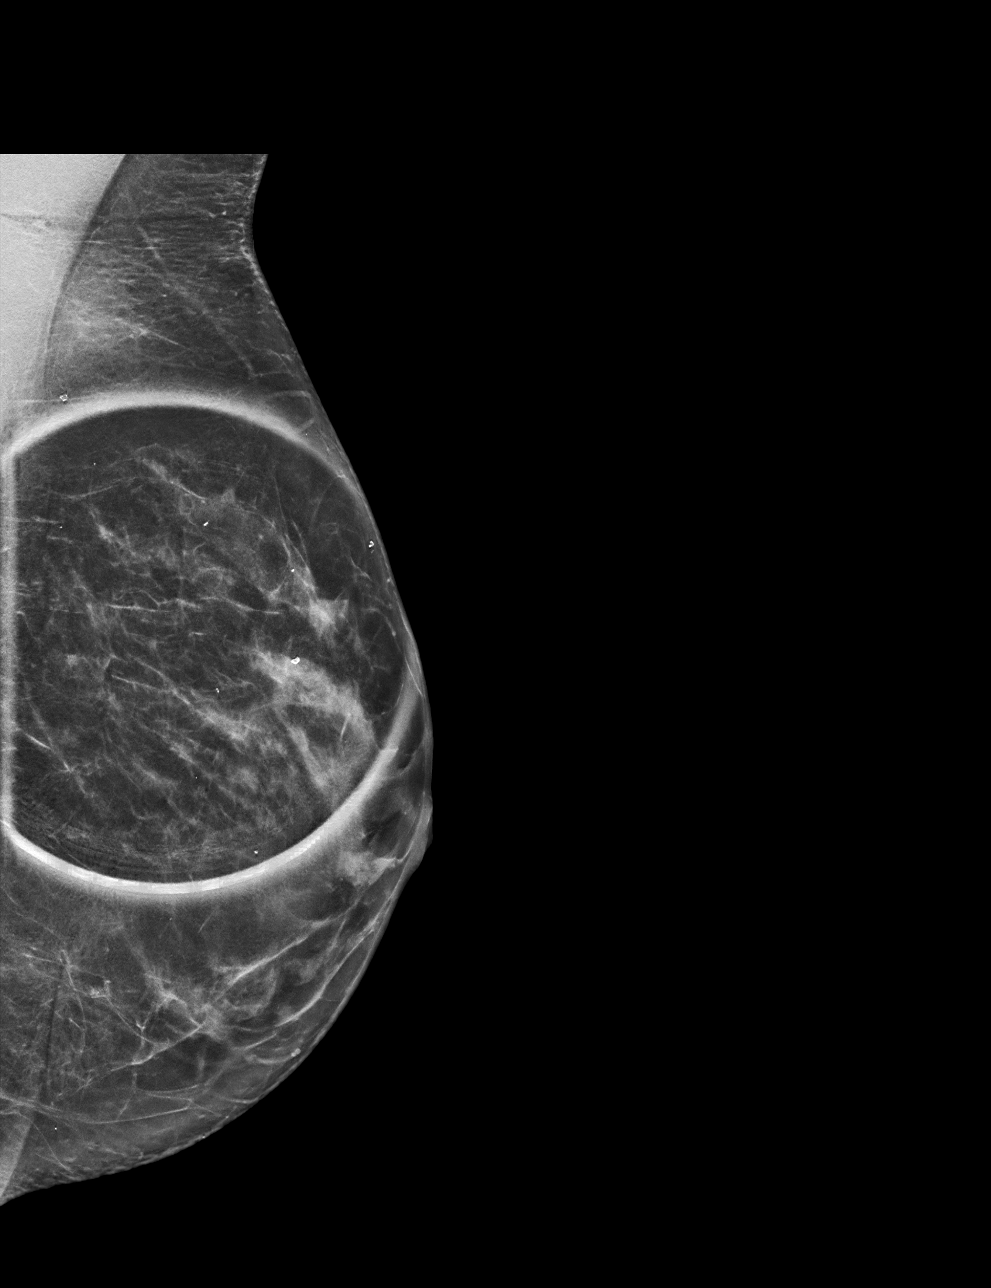

[L CC tomo · tomo slice 29/56.0]
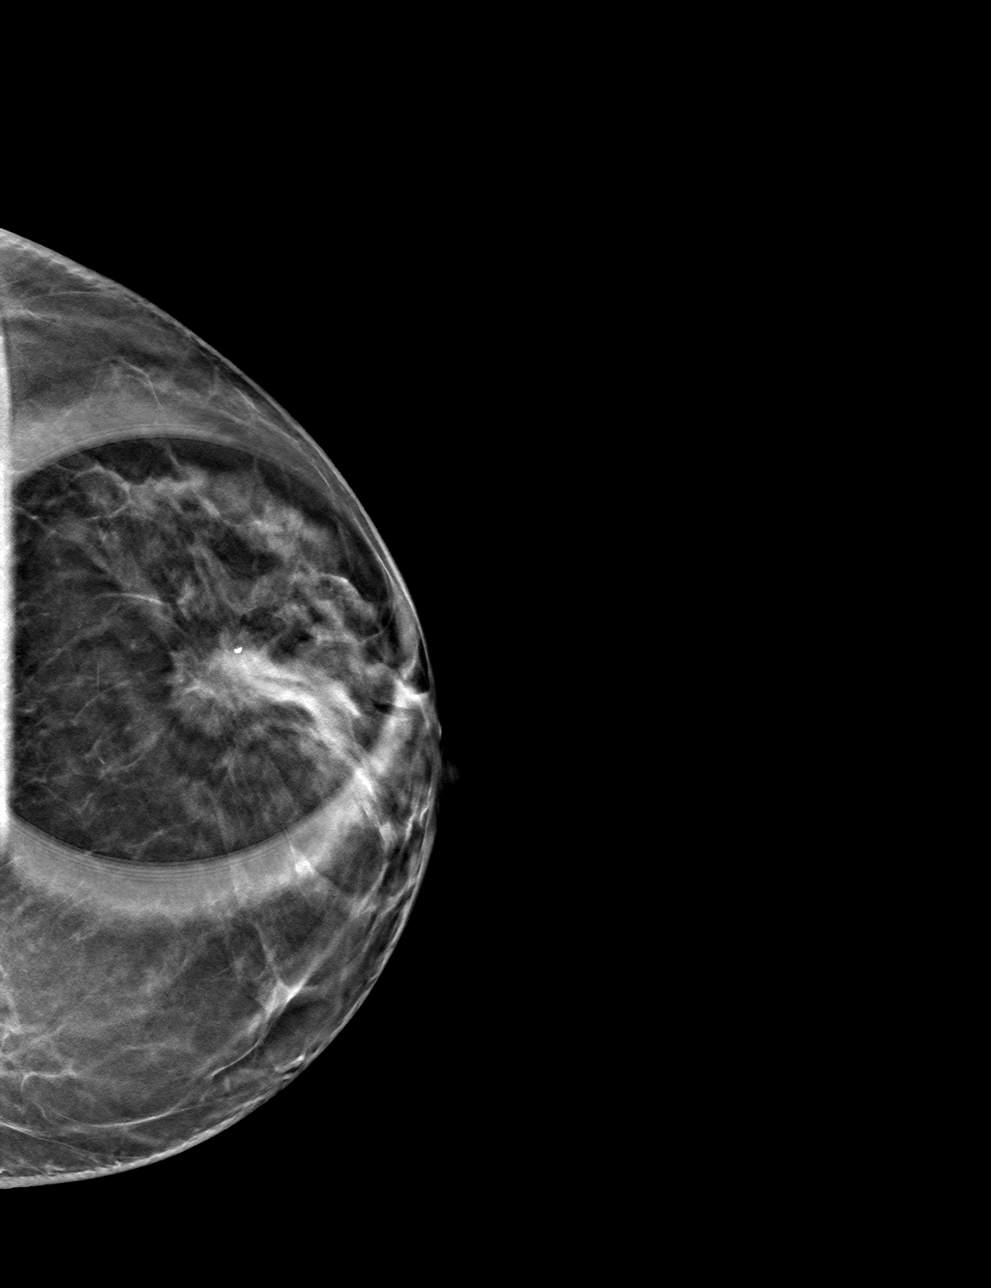

[L MLO tomo · tomo slice 31/62.0]
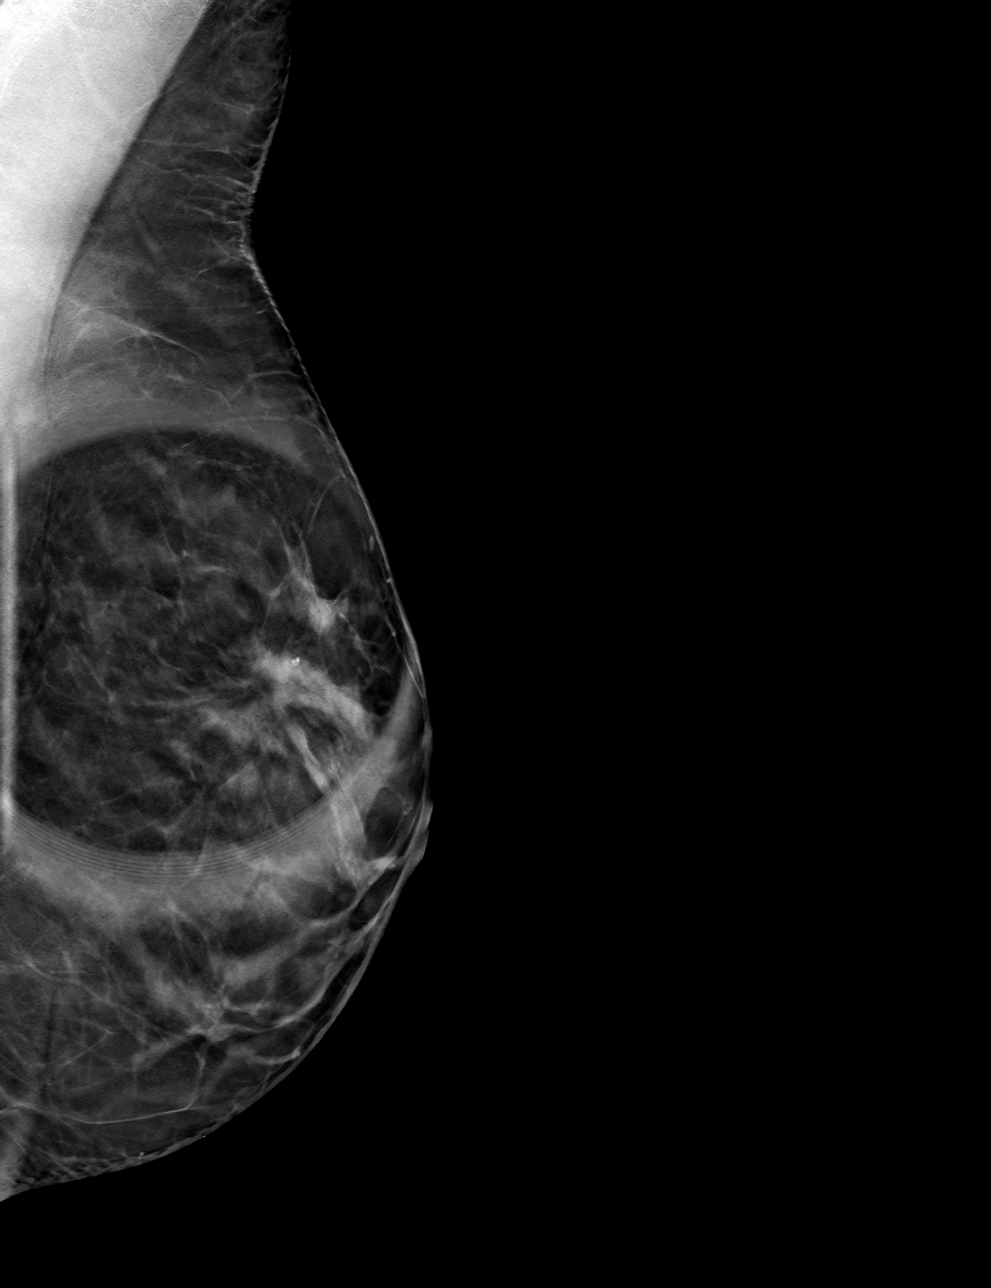

[4 of 12 positions shown; findings below may reference images not displayed]

ACR Breast Density Category c: The breast tissue is heterogeneously
dense, which may obscure small masses.
FINDINGS: Spot compression tomosynthesis images through the upper outer
quadrant of the left breast demonstrate a persistent mass-like area
of distortion spanning approximately 2 cm.

Mammographic images were processed with CAD.

Ultrasound targeted to the left breast at 1 o'clock, 2 cm from the
nipple demonstrates an irregular hypoechoic mass measuring 1.3 x
x 0.8 cm. Ultrasound of the left axilla demonstrates 1 prominent
lymph node with a cortex measuring up to 4 mm.
IMPRESSION: 1. There is a highly suspicious mass in the left breast at 1
o'clock. By ultrasound this measures 1.3 cm, but appears closer to 2
cm mammographically.

2. One indeterminate left axillary lymph node with cortex measuring
up to 4 mm.

RECOMMENDATION:
Ultrasound guided biopsy is recommended for the left breast mass and
for the left axillary lymph node. The procedure has been scheduled
for 12/31/2019 at [DATE] a.m.

I have discussed the findings and recommendations with the patient.
If applicable, a reminder letter will be sent to the patient
regarding the next appointment.

BI-RADS CATEGORY  5: Highly suggestive of malignancy.

## 2022-11-02 ENCOUNTER — Other Ambulatory Visit: Payer: Self-pay | Admitting: Obstetrics

## 2022-11-02 DIAGNOSIS — Z9889 Other specified postprocedural states: Secondary | ICD-10-CM

## 2022-11-07 ENCOUNTER — Inpatient Hospital Stay: Payer: 59 | Attending: Hematology and Oncology | Admitting: Hematology and Oncology

## 2022-11-07 VITALS — BP 112/76 | HR 63 | Temp 99.0°F | Resp 17 | Wt 149.9 lb

## 2022-11-07 DIAGNOSIS — Z170 Estrogen receptor positive status [ER+]: Secondary | ICD-10-CM | POA: Diagnosis not present

## 2022-11-07 DIAGNOSIS — R232 Flushing: Secondary | ICD-10-CM | POA: Diagnosis not present

## 2022-11-07 DIAGNOSIS — Z79810 Long term (current) use of selective estrogen receptor modulators (SERMs): Secondary | ICD-10-CM | POA: Insufficient documentation

## 2022-11-07 DIAGNOSIS — Z923 Personal history of irradiation: Secondary | ICD-10-CM | POA: Insufficient documentation

## 2022-11-07 DIAGNOSIS — C50412 Malignant neoplasm of upper-outer quadrant of left female breast: Secondary | ICD-10-CM

## 2022-11-07 DIAGNOSIS — C50411 Malignant neoplasm of upper-outer quadrant of right female breast: Secondary | ICD-10-CM | POA: Insufficient documentation

## 2022-11-07 MED ORDER — TAMOXIFEN CITRATE 10 MG PO TABS: 10.0000 mg | ORAL_TABLET | Freq: Every day | ORAL | 3 refills | Status: DC

## 2022-11-07 NOTE — Progress Notes (Signed)
Patient Care Team: Myrlene Broker, MD as PCP - General (Internal Medicine) Antony Blackbird, MD as Consulting Physician (Radiation Oncology) Serena Croissant, MD as Consulting Physician (Hematology and Oncology) Emelia Loron, MD as Consulting Physician (General Surgery) Suzi Roots as Physician Assistant (Dermatology)  DIAGNOSIS:  Encounter Diagnosis  Name Primary?   Malignant neoplasm of upper-outer quadrant of left breast in female, estrogen receptor positive (HCC) Yes    SUMMARY OF ONCOLOGIC HISTORY: Oncology History  Malignant neoplasm of upper-outer quadrant of left breast in female, estrogen receptor positive (HCC)  12/31/2019 Initial Diagnosis   Screening detected left breast mass, 1 o'clock position: 1.3 cm by ultrasound (about 2 cm by mammogram), 1 indeterminate left axillary lymph node. 12/31/2019: Biopsy revealed grade 2 IDC with DCIS, lymph node benign, ER greater than 95%, PR greater than 95%, Ki-67 10%, HER-2 negative ratio 1.18, copy number: 2   01/10/2020 Cancer Staging   Staging form: Breast, AJCC 8th Edition - Clinical: Stage IA (cT1c, cN0, cM0, G2, ER+, PR+, HER2-)   01/17/2020 Surgery   Left lumpectomy Dwain Sarna) [phone]): IDC, grade 2, 2.1cm, with high grade DCIS, carcinoma extending to the lateral and superior margin juncture, 5 right axillary lymph nodes negative for carcinoma.    02/04/2020 Oncotype testing   The Oncotype DX score was 15 predicting a risk of outside the breast recurrence over the next 9 years of 4% if the patient's only systemic therapy is tamoxifen for 5 years.    02/06/2020 Surgery   Re-excision of the lateral margin Dwain Sarna) [phone]): no residual carcinoma.    03/17/2020 - 04/07/2020 Radiation Therapy   The patient initially received a dose of 42.72 Gy in 16 fractions to the breast using whole-breast tangent fields. This was delivered using a 3-D conformal technique. The total dose was 42.72 Gy.    05/2020 -  Anti-estrogen oral therapy   Tamoxifen     CHIEF COMPLIANT: Follow-up on tamoxifen   History of Present Illness   The patient, with a history of breast cancer, presents for a routine follow-up. She reports experiencing hot flashes, which she describes as 'crushing' at times, but notes that these symptoms seem to 'ebb and flow.' She previously tried Effexor for symptom management but discontinued due to a significant decrease in libido. Since discontinuing Effexor, she reports feeling more like herself and her libido has improved. She has decided to manage the hot flashes without medication.  The patient denies experiencing muscle cramps. She reports occasional mood swings and irritability, but notes that regular exercise seems to help manage these symptoms. She has gained some weight recently, which she attributes to middle age.  She reports tolerating the medication well and is willing to continue the therapy unless there is a compelling reason to switch.        ALLERGIES:  has No Known Allergies.  MEDICATIONS:  Current Outpatient Medications  Medication Sig Dispense Refill   cholecalciferol (VITAMIN D3) 25 MCG (1000 UNIT) tablet Take 1,000 Units by mouth daily.     guaiFENesin (MUCINEX) 600 MG 12 hr tablet Take by mouth 2 (two) times daily.     ibuprofen (ADVIL,MOTRIN) 200 MG tablet Take 200 mg by mouth every 6 (six) hours as needed.     magnesium (MAGTAB) 84 MG ( ) TBCR SR tablet Take 84 mg by mouth.     RYALTRIS [id number] MCG/ACT SUSP Place into both nostrils.     tiZANidine (ZANAFLEX) 4 MG tablet TAKE 1 TABLET BY MOUTH AT BEDTIME. 90 tablet  1   triamcinolone cream (KENALOG) 0.1 %      vitamin E 200 UNIT capsule Take 200 Units by mouth daily.     tamoxifen (NOLVADEX) 10 MG tablet Take 1 tablet (10 mg total) by mouth daily. 90 tablet 3   No current facility-administered medications for this visit.    PHYSICAL EXAMINATION: ECOG PERFORMANCE STATUS: 1 - Symptomatic but  completely ambulatory  Vitals:   11/07/22 1500  BP: 112/76  Pulse: 63  Resp: 17  Temp: 99 F (37.2 C)  SpO2: 100%   Filed Weights   11/07/22 1500  Weight: 149 lb 14.4 oz (68 kg)     LABORATORY DATA:  I have reviewed the data as listed    Latest Ref Rng & Units 05/21/2012    3:01 PM 01/02/2012    6:00 AM  CMP  Glucose 70 - 99 mg/dL 83  93   BUN 6 - 23 mg/dL 14  10   Creatinine 5.62 - 1.10 mg/dL 1.30  8.65   Sodium 784 - 145 mEq/L 143  138   Potassium 3.5 - 5.1 mEq/L 3.8  3.6   Chloride 96 - 112 mEq/L 102  103   CO2 19 - 32 mEq/L  28   Calcium 8.4 - 10.5 mg/dL  8.9   Total Protein 6.0 - 8.3 g/dL  7.0   Total Bilirubin 0.3 - 1.2 mg/dL  0.3   Alkaline Phos 39 - 117 U/L  46   AST 0 - 37 U/L  17   ALT 0 - 35 U/L  9     Lab Results  Component Value Date   WBC 5.7 12/28/2011   HGB 13.0 09/03/2012   HCT 41.0 05/21/2012   MCV 89.1 12/28/2011   PLT 203 12/28/2011    ASSESSMENT & PLAN:  Malignant neoplasm of upper-outer quadrant of left breast in female, estrogen receptor positive (HCC) Screening detected left breast mass, 1 o'clock position: 1.3 cm by ultrasound (about 2 cm by mammogram), 1 indeterminate left axillary lymph node. 12/31/2019: Biopsy revealed grade 2 IDC with DCIS, lymph node benign, ER greater than 95%, PR greater than 95%, Ki-67 10%, HER-2 negative ratio 1.18, copy number: 2   Recommendations: 1. 01/17/20: Left lumpectomy Dwain Sarna): IDC, grade 2, 2.1cm, with high grade DCIS, carcinoma extending to the lateral and superior margin juncture, 5 right axillary lymph nodes negative for carcinoma.  2. Oncotype DX: 15, ROR 4% 3. Adjuvant radiation therapy started 03/18/20 4. Adjuvant antiestrogen therapy ------------------------------------------------------------------------------------------------------------- Treatment Plan:  Tamoxifen 20 mg daily started 05/01/2020, reduce dosage to 10 mg daily Reason for his changing from anastrozole to tamoxifen was that  she had FSH level of 24 on 04/22/2020 and surprisingly it went up to 39 on 04/29/2020.  (She had a hysterectomy previously) Recent FSH at her gynecologist: 30 Therefore she is not in menopause by lab criteria.   Tamoxifen toxicities: Severe hot flashes emotional changes mental cloudiness Effexor has helped her significantly. Hot flashes have also improved because of the lower dosage of tamoxifen. However Effexor is giving her loss of libido.Dose reduced for Effexor dose back to 37.5 mg.      She plays golf quite regularly and is using the gym for strengthening exercises.   Breast cancer surveillance:  1.  Mammogram scheduled for 12/12/2022 2. breast exam 11/07/2022: Benign    Breast Cancer Survivorship Patient is 3 years post-diagnosis and tolerating Tamoxifen well. Experiencing intermittent hot flashes, but has declined Effexor due to libido side effects. Mood  swings and irritability managed with regular exercise. -Continue Tamoxifen. -Encourage regular exercise for mood regulation and weight management.  Menopausal Status Unclear if patient has entered menopause. Last [facility] check was not recent. -Recommend [facility] check with gynecologist at next visit.  Breast Health No reported breast pain or discomfort. Mammogram scheduled for November. -Continue with scheduled mammogram. -No indication for MRI given patient's risk profile and breast density.  General Health Maintenance -Continue regular exercise and healthy diet for weight management.          No orders of the defined types were placed in this encounter.  The patient has a good understanding of the overall plan. she agrees with it. she will call with any problems that may develop before the next visit here. Total time spent: 30 mins including face to face time and time spent for planning, charting and co-ordination of care   Tamsen Meek, MD 11/07/22

## 2022-11-07 NOTE — Assessment & Plan Note (Signed)
Screening detected left breast mass, 1 o'clock position: 1.3 cm by ultrasound (about 2 cm by mammogram), 1 indeterminate left axillary lymph node. 12/31/2019: Biopsy revealed grade 2 IDC with DCIS, lymph node benign, ER greater than 95%, PR greater than 95%, Ki-67 10%, HER-2 negative ratio 1.18, copy number: 2   Recommendations: 1. 01/17/20: Left lumpectomy Shelly [name]): IDC, grade 2, 2.1cm, with high grade DCIS, carcinoma extending to the lateral and superior margin juncture, 5 right axillary lymph nodes negative for carcinoma.  2. Oncotype DX: 15, ROR 4% 3. Adjuvant radiation therapy started 03/18/20 4. Adjuvant antiestrogen therapy ------------------------------------------------------------------------------------------------------------- Treatment Plan:  Tamoxifen 20 mg daily started 05/01/2020, reduce dosage to 10 mg daily Reason for his changing from anastrozole to tamoxifen was that she had FSH level of 24 on 04/22/2020 and surprisingly it went up to 39 on 04/29/2020.  (She had a hysterectomy previously) Recent FSH at her gynecologist: 30 Therefore she is not in menopause by lab criteria.   Tamoxifen toxicities: Severe hot flashes emotional changes mental cloudiness Effexor has helped her significantly. Hot flashes have also improved because of the lower dosage of tamoxifen. However Effexor is giving her loss of libido.Dose reduced for Effexor dose back to 37.5 mg.      She plays golf quite regularly and is using the gym for strengthening exercises.   Breast cancer surveillance:  1.  Mammogram scheduled for 12/12/2022 2. breast exam 11/07/2022: Benign Return to clinic in 1 year for follow-up

## 2022-12-12 ENCOUNTER — Ambulatory Visit
Admission: RE | Admit: 2022-12-12 | Discharge: 2022-12-12 | Disposition: A | Discharge status: Home / Self Care | Payer: 59 | Source: Ambulatory Visit | Attending: Obstetrics

## 2022-12-12 DIAGNOSIS — Z9889 Other specified postprocedural states: Secondary | ICD-10-CM

## 2023-09-18 ENCOUNTER — Encounter: Payer: Self-pay | Admitting: Internal Medicine

## 2023-09-18 MED ORDER — ESZOPICLONE 1 MG PO TABS: 1.0000 mg | ORAL_TABLET | Freq: Every evening | ORAL | 0 refills | Status: DC | PRN

## 2023-09-22 MED ORDER — ESZOPICLONE 2 MG PO TABS: 2.0000 mg | ORAL_TABLET | Freq: Every evening | ORAL | 0 refills | Status: DC | PRN

## 2023-11-09 ENCOUNTER — Ambulatory Visit: Payer: 59 | Admitting: Hematology and Oncology

## 2023-11-16 ENCOUNTER — Inpatient Hospital Stay: Attending: Hematology and Oncology | Admitting: Hematology and Oncology

## 2023-11-16 VITALS — BP 106/71 | HR 64 | Temp 98.3°F | Resp 17 | Ht 66.0 in | Wt 146.5 lb

## 2023-11-16 DIAGNOSIS — N951 Menopausal and female climacteric states: Secondary | ICD-10-CM | POA: Diagnosis not present

## 2023-11-16 DIAGNOSIS — C50412 Malignant neoplasm of upper-outer quadrant of left female breast: Secondary | ICD-10-CM | POA: Diagnosis not present

## 2023-11-16 DIAGNOSIS — Z923 Personal history of irradiation: Secondary | ICD-10-CM | POA: Diagnosis not present

## 2023-11-16 DIAGNOSIS — Z79810 Long term (current) use of selective estrogen receptor modulators (SERMs): Secondary | ICD-10-CM | POA: Insufficient documentation

## 2023-11-16 DIAGNOSIS — Z79899 Other long term (current) drug therapy: Secondary | ICD-10-CM | POA: Diagnosis not present

## 2023-11-16 DIAGNOSIS — Z170 Estrogen receptor positive status [ER+]: Secondary | ICD-10-CM | POA: Diagnosis not present

## 2023-11-16 NOTE — Progress Notes (Signed)
 Patient Care Team: Rollene Almarie LABOR, MD as PCP - General (Internal Medicine) [name], MD as Consulting Physician (Radiation Oncology) Odean Potts, MD as Consulting Physician (Hematology and Oncology) Ebbie Cough, MD as Consulting Physician (General Surgery) Sheffield, Andrez SAUNDERS, PA-C (Inactive) as Physician Assistant (Dermatology)  DIAGNOSIS:  Encounter Diagnosis  Name Primary?   Malignant neoplasm of upper-outer quadrant of left breast in female, estrogen receptor positive (HCC) Yes    SUMMARY OF ONCOLOGIC HISTORY: Oncology History  Malignant neoplasm of upper-outer quadrant of left breast in female, estrogen receptor positive (HCC)  12/31/2019 Initial Diagnosis   Screening detected left breast mass, 1 o'clock position: 1.3 cm by ultrasound (about 2 cm by mammogram), 1 indeterminate left axillary lymph node. 12/31/2019: Biopsy revealed grade 2 IDC with DCIS, lymph node benign, ER greater than 95%, PR greater than 95%, Ki-67 10%, HER-2 negative ratio 1.18, copy number: 2   01/10/2020 Cancer Staging   Staging form: Breast, AJCC 8th Edition - Clinical: Stage IA (cT1c, cN0, cM0, G2, ER+, PR+, HER2-)   01/17/2020 Surgery   Left lumpectomy Viktoria) [phone]): IDC, grade 2, 2.1cm, with high grade DCIS, carcinoma extending to the lateral and superior margin juncture, 5 right axillary lymph nodes negative for carcinoma.    02/04/2020 Oncotype testing   The Oncotype DX score was 15 predicting a risk of outside the breast recurrence over the next 9 years of 4% if the patient's only systemic therapy is tamoxifen  for 5 years.    02/06/2020 Surgery   Re-excision of the lateral margin Viktoria) [phone]): no residual carcinoma.    03/17/2020 - 04/07/2020 Radiation Therapy   The patient initially received a dose of 42.72 Gy in 16 fractions to the breast using whole-breast tangent fields. This was delivered using a 3-D conformal technique. The total dose was 42.72  Gy.   05/2020 -  Anti-estrogen oral therapy   Tamoxifen      CHIEF COMPLIANT: Follow-up on tamoxifen   HISTORY OF PRESENT ILLNESS:   History of Present Illness Shelly [name] is a 53 year old female with breast cancer on tamoxifen  therapy who presents for follow-up regarding her treatment and symptoms.  She has been on tamoxifen  for three years and experiences hot flashes, which she manages with Feso. Feso is effective until it stops working, after which she discontinues it for a couple of weeks before resuming. She previously tried Effexor  but stopped due to side effects affecting libido.  Her menopausal status is uncertain as no recent FSH levels have been checked. She experiences moodiness, weight gain, and hot flashes, which she attributes to menopause. She has not started anastrozole  due to concerns about muscle and joint pain.  She is currently on 10 mg of tamoxifen . Her recent mammogram results are satisfactory.     ALLERGIES:  has no known allergies.  MEDICATIONS:  Current Outpatient Medications  Medication Sig Dispense Refill   cholecalciferol (VITAMIN D3) 25 MCG (1000 UNIT) tablet Take 1,000 Units by mouth daily.     eszopiclone  (LUNESTA ) 2 MG TABS tablet Take 1 tablet (2 mg total) by mouth at bedtime as needed for sleep. Take immediately before bedtime 30 tablet 0   guaiFENesin (MUCINEX) 600 MG 12 hr tablet Take by mouth 2 (two) times daily.     ibuprofen (ADVIL,MOTRIN) 200 MG tablet Take 200 mg by mouth every 6 (six) hours as needed.     magnesium (MAGTAB) 84 MG ( ) TBCR SR tablet Take 84 mg by mouth.     RYALTRIS [id number]  MCG/ACT SUSP Place into both nostrils.     tamoxifen  (NOLVADEX ) 10 MG tablet Take 1 tablet (10 mg total) by mouth daily. 90 tablet 3   tiZANidine  (ZANAFLEX ) 4 MG tablet TAKE 1 TABLET BY MOUTH AT BEDTIME. 90 tablet 1   triamcinolone cream (KENALOG) 0.1 %      vitamin E 200 UNIT capsule Take 200 Units by mouth daily.     No current  facility-administered medications for this visit.    PHYSICAL EXAMINATION: ECOG PERFORMANCE STATUS: 1 - Symptomatic but completely ambulatory  Vitals:   11/16/23 1507  BP: 106/71  Pulse: 64  Resp: 17  Temp: 98.3 F (36.8 C)  SpO2: 99%   Filed Weights   11/16/23 1507  Weight: 146 lb 8 oz (66.5 kg)      LABORATORY DATA:  I have reviewed the data as listed    Latest Ref Rng & Units 05/21/2012    3:01 PM 01/02/2012    6:00 AM  CMP  Glucose 70 - 99 mg/dL 83  93   BUN 6 - 23 mg/dL 14  10   Creatinine 9.49 - 1.10 mg/dL 9.19  9.33   Sodium 864 - 145 mEq/L 143  138   Potassium 3.5 - 5.1 mEq/L 3.8  3.6   Chloride 96 - 112 mEq/L 102  103   CO2 19 - 32 mEq/L  28   Calcium 8.4 - 10.5 mg/dL  8.9   Total Protein 6.0 - 8.3 g/dL  7.0   Total Bilirubin 0.3 - 1.2 mg/dL  0.3   Alkaline Phos 39 - 117 U/L  46   AST 0 - 37 U/L  17   ALT 0 - 35 U/L  9     Lab Results  Component Value Date   WBC 5.7 12/28/2011   HGB 13.0 09/03/2012   HCT 41.0 05/21/2012   MCV 89.1 12/28/2011   PLT 203 12/28/2011    ASSESSMENT & PLAN:  Malignant neoplasm of upper-outer quadrant of left breast in female, estrogen receptor positive (HCC) Screening detected left breast mass, 1 o'clock position: 1.3 cm by ultrasound (about 2 cm by mammogram), 1 indeterminate left axillary lymph node. 12/31/2019: Biopsy revealed grade 2 IDC with DCIS, lymph node benign, ER greater than 95%, PR greater than 95%, Ki-67 10%, HER-2 negative ratio 1.18, copy number: 2   Recommendations: 1. 01/17/20: Left lumpectomy Viktoria): IDC, grade 2, 2.1cm, with high grade DCIS, carcinoma extending to the lateral and superior margin juncture, 5 right axillary lymph nodes negative for carcinoma.  2. Oncotype DX: 15, ROR 4% 3. Adjuvant radiation therapy started 03/18/20 4. Adjuvant antiestrogen therapy ------------------------------------------------------------------------------------------------------------- Treatment Plan:  Tamoxifen   20 mg daily started 05/01/2020, reduce dosage to 10 mg daily Reason for his changing from anastrozole  to tamoxifen  was that she had FSH level of 24 on 04/22/2020 and surprisingly it went up to 39 on 04/29/2020.  (She had a hysterectomy previously)   She will do blood work with her gynecologist to decide if she is in menopause. Therefore she is not in menopause by lab criteria.   Tamoxifen  toxicities: Severe hot flashes emotional changes mental cloudiness   Hot flashes have also improved because of the lower dosage of tamoxifen . She is doing very well with Veozah.      She plays golf quite regularly and is using the gym for strengthening exercises.   Breast cancer surveillance:  1.  Mammogram 12/12/2022: Benign breast density category C At the end of 5 years we  will consider doing breast cancer index to determine if she would benefit from extended endocrine therapy.  Return to clinic in 1 year for follow-up      No orders of the defined types were placed in this encounter.  The patient has a good understanding of the overall plan. she agrees with it. she will call with any problems that may develop before the next visit here.  I personally spent a total of 30 minutes in the care of the patient today including preparing to see the patient, getting/reviewing separately obtained history, performing a medically appropriate exam/evaluation, counseling and educating, placing orders, referring and communicating with other health care professionals, documenting clinical information in the EHR, independently interpreting results, communicating results, and coordinating care.   Viinay K Germain Koopmann, MD 11/16/23

## 2023-11-16 NOTE — Assessment & Plan Note (Signed)
 Screening detected left breast mass, 1 o'clock position: 1.3 cm by ultrasound (about 2 cm by mammogram), 1 indeterminate left axillary lymph node. 12/31/2019: Biopsy revealed grade 2 IDC with DCIS, lymph node benign, ER greater than 95%, PR greater than 95%, Ki-67 10%, HER-2 negative ratio 1.18, copy number: 2   Recommendations: 1. 01/17/20: Left lumpectomy Viktoria): IDC, grade 2, 2.1cm, with high grade DCIS, carcinoma extending to the lateral and superior margin juncture, 5 right axillary lymph nodes negative for carcinoma.  2. Oncotype DX: 15, ROR 4% 3. Adjuvant radiation therapy started 03/18/20 4. Adjuvant antiestrogen therapy ------------------------------------------------------------------------------------------------------------- Treatment Plan:  Tamoxifen  20 mg daily started 05/01/2020, reduce dosage to 10 mg daily Reason for his changing from anastrozole  to tamoxifen  was that she had FSH level of 24 on 04/22/2020 and surprisingly it went up to 39 on 04/29/2020.  (She had a hysterectomy previously) Recent FSH at her gynecologist: 30 Therefore she is not in menopause by lab criteria.   Tamoxifen  toxicities: Severe hot flashes emotional changes mental cloudiness Effexor  has helped her significantly. Hot flashes have also improved because of the lower dosage of tamoxifen . However Effexor  is giving her loss of libido.Dose reduced for Effexor  dose back to 37.5 mg.      She plays golf quite regularly and is using the gym for strengthening exercises.   Breast cancer surveillance:  1.  Mammogram 12/12/2022: Benign breast density category C 2. breast exam 11/16/2023: Benign  Return to clinic in 1 year for follow-up

## 2023-12-07 ENCOUNTER — Other Ambulatory Visit: Payer: Self-pay | Admitting: Obstetrics

## 2023-12-07 DIAGNOSIS — Z1231 Encounter for screening mammogram for malignant neoplasm of breast: Secondary | ICD-10-CM

## 2023-12-11 ENCOUNTER — Encounter: Payer: Self-pay | Admitting: Hematology and Oncology

## 2023-12-13 NOTE — Progress Notes (Signed)
 [facility] Cancer Follow up:    Shelly [name] LABOR, MD [address] [place] [address]   DIAGNOSIS:  Cancer Staging  Malignant neoplasm of upper-outer quadrant of left breast in female, estrogen receptor positive (HCC) Staging form: Breast, AJCC 8th Edition - Clinical: Stage IA (cT1c, cN0, cM0, G2, ER+, PR+, HER2-) - Signed by Odean Potts, MD on 01/10/2020    SUMMARY OF ONCOLOGIC HISTORY: Oncology History  Malignant neoplasm of upper-outer quadrant of left breast in female, estrogen receptor positive (HCC)  12/31/2019 Initial Diagnosis   Screening detected left breast mass, 1 o'clock position: 1.3 cm by ultrasound (about 2 cm by mammogram), 1 indeterminate left axillary lymph node. 12/31/2019: Biopsy revealed grade 2 IDC with DCIS, lymph node benign, ER greater than 95%, PR greater than 95%, Ki-67 10%, HER-2 negative ratio 1.18, copy number: 2   01/10/2020 Cancer Staging   Staging form: Breast, AJCC 8th Edition - Clinical: Stage IA (cT1c, cN0, cM0, G2, ER+, PR+, HER2-)   01/17/2020 Surgery   Left lumpectomy Viktoria) [phone]): IDC, grade 2, 2.1cm, with high grade DCIS, carcinoma extending to the lateral and superior margin juncture, 5 right axillary lymph nodes negative for carcinoma.    02/04/2020 Oncotype testing   The Oncotype DX score was 15 predicting a risk of outside the breast recurrence over the next 9 years of 4% if the patient's only systemic therapy is tamoxifen  for 5 years.    02/06/2020 Surgery   Re-excision of the lateral margin Viktoria) [phone]): no residual carcinoma.    03/17/2020 - 04/07/2020 Radiation Therapy   The patient initially received a dose of 42.72 Gy in 16 fractions to the breast using whole-breast tangent fields. This was delivered using a 3-D conformal technique. The total dose was 42.72 Gy.   05/2020 -  Anti-estrogen oral therapy   Tamoxifen      CURRENT THERAPY: tamoxifen   INTERVAL  HISTORY:  Discussed the use of AI scribe software for clinical note transcription with the patient, who gave verbal consent to proceed.  History of Present Illness Shelly [name] is a 53 year old female with left breast invasive ductal carcinoma who presents with a breast change.  She was diagnosed with ERPR positive left breast invasive ductal carcinoma in November 2021. She underwent a lumpectomy and adjuvant radiation therapy and is currently on tamoxifen .  Her most recent bilateral diagnostic mammogram in November 2024 showed no evidence of malignancy, with breast density category C.  She developed a rash on the irradiated breast after applying a topical medication for a skin lesion. The rash is characterized by redness, fullness, swelling, and tenderness. Symptoms improved after discontinuing the medication, but the rash persists.   Patient Active Problem List   Diagnosis Date Noted   Interdigital neuroma of right foot 05/11/2022   Allergic rhinitis 04/15/2022   Strain of cervical portion of left trapezius muscle 12/08/2021   Routine general medical examination at a health care facility 09/24/2021   History of basal cell carcinoma (BCC) 09/21/2021   Malignant neoplasm of upper-outer quadrant of left breast in female, estrogen receptor positive (HCC) 01/10/2020    has no known allergies.  MEDICAL HISTORY: Past Medical History:  Diagnosis Date   Cancer (HCC) 01/2020   left breast IDC with DCIS   Fibroid 01/2012   Robotic TLH uterus 700g   History of radiation therapy 03/18/20-04/07/20   Left breast- Adjuvant radiation,- Dr. [name]    No pertinent past medical history    Nodular  basal cell carcinoma (BCC) 07/01/2020   Right Ala Nasi   Personal history of radiation therapy    PONV (postoperative nausea and vomiting)     SURGICAL HISTORY: Past Surgical History:  Procedure Laterality Date   ABDOMINAL HYSTERECTOMY     BREAST BIOPSY Left 12/31/2019   BREAST  LUMPECTOMY Left 01/17/2020   BREAST LUMPECTOMY WITH RADIOACTIVE SEED AND SENTINEL LYMPH NODE BIOPSY Left 01/17/2020   Procedure: LEFT BREAST LUMPECTOMY WITH RADIOACTIVE SEED AND LEFT AXILLARY SENTINEL LYMPH NODE BIOPSY;  Surgeon: Ebbie Cough, MD;  Location: [facility];  Service: General;  Laterality: Left;  PECTORAL BLOCK   CYSTOSCOPY  01/02/2012   Procedure: CYSTOSCOPY;  Surgeon: Montie SHAUNNA Chesterfield, MD;  Location: [facility];  Service: Gynecology;  Laterality: N/A;   DILATION AND CURETTAGE OF UTERUS     HYSTEROSCOPY     OPEN REDUCTION INTERNAL FIXATION (ORIF) DISTAL RADIAL FRACTURE Left 03/27/2016   Procedure: OPEN REDUCTION INTERNAL FIXATION (ORIF) DISTAL RADIAL FRACTURE;  Surgeon: Prentice Pagan, MD;  Location: [facility];  Service: Orthopedics;  Laterality: Left;   RE-EXCISION OF BREAST LUMPECTOMY Left 02/06/2020   Procedure: RE-EXCISION OF LEFT BREAST LUMPECTOMY;  Surgeon: Ebbie Cough, MD;  Location: [facility];  Service: General;  Laterality: Left;   ROBOTIC ASSISTED TOTAL HYSTERECTOMY  01/02/2012   Procedure: ROBOTIC ASSISTED TOTAL HYSTERECTOMY;  Surgeon: Montie SHAUNNA Chesterfield, MD;  Location: [facility];  Service: Gynecology;  Laterality: N/A;  Roboitc Bilateral Salpingectomy    TONSILLECTOMY     uterine ablation      SOCIAL HISTORY: Social History   Socioeconomic History   Marital status: Married    Spouse name: Not on file   Number of children: Not on file   Years of education: Not on file   Highest education level: Not on file  Occupational History   Not on file  Tobacco Use   Smoking status: Former    Current packs/day: 0.00    Types: Cigarettes    Quit date: 02/01/1996    Years since quitting: 27.8   Smokeless tobacco: Never  Vaping Use   Vaping status: Never Used  Substance and Sexual Activity   Alcohol use: Not Currently   Drug use: No   Sexual activity: Yes    Partners: Female    Birth control/protection: None  Other Topics Concern   Not  on file  Social History Narrative   Not on file   Social Drivers of Health   Financial Resource Strain: Not on file  Food Insecurity: Not on file  Transportation Needs: Not on file  Physical Activity: Not on file  Stress: Not on file  Social Connections: Not on file  Intimate Partner Violence: Not on file    FAMILY HISTORY: Family History  Problem Relation Age of Onset   Thyroid disease Mother    Breast cancer Mother 68       breast cancer    Review of Systems  Constitutional:  Negative for appetite change, chills, fatigue, fever and unexpected weight change.  HENT:   Negative for hearing loss, lump/mass and trouble swallowing.   Eyes:  Negative for eye problems and icterus.  Respiratory:  Negative for chest tightness, cough and shortness of breath.   Cardiovascular:  Negative for chest pain, leg swelling and palpitations.  Gastrointestinal:  Negative for abdominal distention, abdominal pain, constipation, diarrhea, nausea and vomiting.  Endocrine: Negative for hot flashes.  Genitourinary:  Negative for difficulty urinating.   Musculoskeletal:  Negative for arthralgias.  Skin:  Negative for itching and rash.  Neurological:  Negative for dizziness, extremity weakness, headaches and numbness.  Hematological:  Negative for adenopathy. Does not bruise/bleed easily.  Psychiatric/Behavioral:  Negative for depression. The patient is not nervous/anxious.       PHYSICAL EXAMINATION   Onc Performance Status - 12/14/23 1000       ECOG Perf Status   ECOG Perf Status Restricted in physically strenuous activity but ambulatory and able to carry out work of a light or sedentary nature, e.g., light house work, office work (P)       KPS SCALE   KPS % SCORE Able to carry on normal activity, minor s/s of disease (P)           Vitals:   12/14/23 1047  BP: 115/73  Pulse: 70  Resp: 16  Temp: 98.3 F (36.8 C)  SpO2: 100%    Physical Exam Constitutional:      General: She is  not in acute distress.    Appearance: Normal appearance. She is not toxic-appearing.  Eyes:     General: No scleral icterus. Chest:     Comments: Right breast benign, left breast with mild swelling, and fullness noted particularly at left areola Lymphadenopathy:     Upper Body:     Right upper body: No supraclavicular or axillary adenopathy.     Left upper body: No supraclavicular or axillary adenopathy.  Skin:    General: Skin is warm and dry.     Findings: No rash.  Neurological:     General: No focal deficit present.     Mental Status: She is alert.  Psychiatric:        Mood and Affect: Mood normal.        Behavior: Behavior normal.         ASSESSMENT and THERAPY PLAN:   Assessment and Plan Assessment & Plan Radiation recall dermatitis of the left breast Radiation recall dermatitis likely triggered by topical gel. Symptoms improved post-discontinuation but not fully resolved. Differential includes lymphedema, less likely. - Discontinued topical gel. - Scheduled diagnostic mammogram for bilateral breast as mammogram is due - Instructed to monitor and report recurrence of rash or swelling.  Left breast invasive ductal carcinoma, ER/PR positive, post-treatment Diagnosed November 2020, post-lumpectomy, adjuvant radiation, ongoing tamoxifen . November 2024 mammogram negative for malignancy. - Continue tamoxifen  therapy. - Ensure diagnostic mammogram scheduled with radiologist   RTC as scheduled with Dr. Gudena in 10/2024 or sooner if needed.   All questions were answered. The patient knows to call the clinic with any problems, questions or concerns. We can certainly see the patient much sooner if necessary.  Total encounter time:20 minutes*in face-to-face visit time, chart review, lab review, care coordination, order entry, and documentation of the encounter time.    Morna Kendall, NP 12/14/23 2:46 PM Medical Oncology and Hematology [facility] [address] [place], [place] [address] Tel. [phone]    Fax. [phone]  *Total Encounter Time as defined by the Centers for Medicare and Medicaid Services includes, in addition to the face-to-face time of a patient visit (documented in the note above) non-face-to-face time: obtaining and reviewing outside history, ordering and reviewing medications, tests or procedures, care coordination (communications with other health care professionals or caregivers) and documentation in the medical record.

## 2023-12-14 ENCOUNTER — Encounter: Payer: Self-pay | Admitting: Adult Health

## 2023-12-14 ENCOUNTER — Other Ambulatory Visit: Payer: Self-pay | Admitting: Adult Health

## 2023-12-14 ENCOUNTER — Inpatient Hospital Stay: Attending: Hematology and Oncology | Admitting: Adult Health

## 2023-12-14 VITALS — BP 115/73 | HR 70 | Temp 98.3°F | Resp 16 | Ht 66.0 in | Wt 144.8 lb

## 2023-12-14 DIAGNOSIS — C50412 Malignant neoplasm of upper-outer quadrant of left female breast: Secondary | ICD-10-CM

## 2023-12-14 DIAGNOSIS — Z803 Family history of malignant neoplasm of breast: Secondary | ICD-10-CM | POA: Insufficient documentation

## 2023-12-14 DIAGNOSIS — Z170 Estrogen receptor positive status [ER+]: Secondary | ICD-10-CM | POA: Insufficient documentation

## 2023-12-14 DIAGNOSIS — Z87891 Personal history of nicotine dependence: Secondary | ICD-10-CM | POA: Diagnosis not present

## 2023-12-14 DIAGNOSIS — L598 Other specified disorders of the skin and subcutaneous tissue related to radiation: Secondary | ICD-10-CM | POA: Insufficient documentation

## 2024-01-02 ENCOUNTER — Ambulatory Visit
Admission: RE | Admit: 2024-01-02 | Discharge: 2024-01-02 | Disposition: A | Source: Ambulatory Visit | Attending: Adult Health | Admitting: Adult Health

## 2024-01-02 ENCOUNTER — Ambulatory Visit: Admission: RE | Admit: 2024-01-02 | Source: Ambulatory Visit

## 2024-01-02 DIAGNOSIS — Z170 Estrogen receptor positive status [ER+]: Secondary | ICD-10-CM

## 2024-01-03 ENCOUNTER — Ambulatory Visit

## 2024-01-30 ENCOUNTER — Other Ambulatory Visit: Payer: Self-pay | Admitting: Hematology and Oncology

## 2024-02-06 ENCOUNTER — Encounter: Payer: Self-pay | Admitting: Family Medicine

## 2024-02-06 ENCOUNTER — Ambulatory Visit: Admitting: Family Medicine

## 2024-02-06 VITALS — BP 124/90 | HR 76 | Ht 66.0 in | Wt 144.0 lb

## 2024-02-06 DIAGNOSIS — M9908 Segmental and somatic dysfunction of rib cage: Secondary | ICD-10-CM | POA: Diagnosis not present

## 2024-02-06 DIAGNOSIS — M9902 Segmental and somatic dysfunction of thoracic region: Secondary | ICD-10-CM | POA: Diagnosis not present

## 2024-02-06 DIAGNOSIS — M9904 Segmental and somatic dysfunction of sacral region: Secondary | ICD-10-CM

## 2024-02-06 DIAGNOSIS — M940 Chondrocostal junction syndrome [Tietze]: Secondary | ICD-10-CM | POA: Insufficient documentation

## 2024-02-06 DIAGNOSIS — M9903 Segmental and somatic dysfunction of lumbar region: Secondary | ICD-10-CM

## 2024-02-06 MED ORDER — TIZANIDINE HCL 4 MG PO TABS: 4.0000 mg | ORAL_TABLET | Freq: Every day | ORAL | 0 refills | Status: DC

## 2024-02-06 MED ORDER — MELOXICAM 15 MG PO TABS: 15.0000 mg | ORAL_TABLET | Freq: Every day | ORAL | 0 refills | Status: AC

## 2024-02-06 NOTE — Progress Notes (Signed)
 " Shelly [name] [address] [place] [address] Phone: [phone] Subjective:   Shelly [name], am serving as a scribe for Dr. Arthea Claudene.  I'm seeing this patient by the request  of:  Rollene Almarie LABOR, MD  CC: Upper back pain  YEP:Dlagzrupcz  Shelly [name] is a 54 y.o. 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 Incident happened about a month ago. Started R scapula and did so some PT. Constantly feeling it. Tried taking zanaflex  and ibuprofen. Has gotten a little better, but not gone. Feels like muscular pain. Not sharp.      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 [name]    No pertinent past medical history    Nodular basal cell carcinoma (BCC) 07/01/2020   Right Ala Nasi   Personal history of radiation therapy    PONV (postoperative nausea and vomiting)    Past Surgical History:  Procedure Laterality Date   ABDOMINAL HYSTERECTOMY     BREAST BIOPSY Left 12/31/2019   BREAST LUMPECTOMY Left 01/17/2020   BREAST LUMPECTOMY WITH RADIOACTIVE SEED AND SENTINEL LYMPH NODE BIOPSY Left 01/17/2020   Procedure: LEFT BREAST LUMPECTOMY WITH RADIOACTIVE SEED AND LEFT AXILLARY SENTINEL LYMPH NODE BIOPSY;  Surgeon: Ebbie Cough, MD;  Location: [facility];  Service: General;  Laterality: Left;  PECTORAL BLOCK   CYSTOSCOPY  01/02/2012   Procedure: CYSTOSCOPY;  Surgeon: Montie SHAUNNA Chesterfield, MD;  Location: [facility];  Service: Gynecology;  Laterality: N/A;   DILATION AND CURETTAGE OF UTERUS     HYSTEROSCOPY     OPEN REDUCTION INTERNAL FIXATION (ORIF) DISTAL RADIAL FRACTURE Left 03/27/2016   Procedure: OPEN REDUCTION INTERNAL FIXATION (ORIF) DISTAL RADIAL FRACTURE;  Surgeon: Prentice Pagan, MD;  Location: [facility];  Service:  Orthopedics;  Laterality: Left;   RE-EXCISION OF BREAST LUMPECTOMY Left 02/06/2020   Procedure: RE-EXCISION OF LEFT BREAST LUMPECTOMY;  Surgeon: Ebbie Cough, MD;  Location: [facility];  Service: General;  Laterality: Left;   ROBOTIC ASSISTED TOTAL HYSTERECTOMY  01/02/2012   Procedure: ROBOTIC ASSISTED TOTAL HYSTERECTOMY;  Surgeon: Montie SHAUNNA Chesterfield, MD;  Location: [facility];  Service: Gynecology;  Laterality: N/A;  Roboitc Bilateral Salpingectomy    TONSILLECTOMY     uterine ablation     Social History   Socioeconomic History   Marital status: Married    Spouse name: Not on file   Number of children: Not on file   Years of education: Not on file   Highest education level: Not on file  Occupational History   Not on file  Tobacco Use   Smoking status: Former    Current packs/day: 0.00    Types: Cigarettes    Quit date: 02/01/1996    Years since quitting: 28.0   Smokeless tobacco: Never  Vaping Use   Vaping status: Never Used  Substance and Sexual Activity   Alcohol use: Not Currently   Drug use: No   Sexual activity: Yes    Partners: Female    Birth control/protection: None  Other Topics Concern   Not on file  Social History Narrative   Not on file   Social Drivers of Health   Tobacco Use: Medium Risk (12/14/2023)   Patient History  Smoking Tobacco Use: Former    Smokeless Tobacco Use: Never    Passive Exposure: Not on Actuary Strain: Not on file  Food Insecurity: Not on file  Transportation Needs: Not on file  Physical Activity: Not on file  Stress: Not on file  Social Connections: Not on file  Depression (PHQ2-9): Low Risk (12/14/2023)   Depression (PHQ2-9)    PHQ-2 Score: 0  Alcohol Screen: Not on file  Housing: Not on file  Utilities: Not on file  Health Literacy: Not on file   Allergies[1] Family History  Problem Relation Age of Onset   Thyroid disease Mother    Breast cancer Mother 20       breast cancer     Current Outpatient Medications (Respiratory):    guaiFENesin (MUCINEX) 600 MG 12 hr tablet, Take by mouth 2 (two) times daily.  Current Outpatient Medications (Analgesics):    meloxicam  (MOBIC ) 15 MG tablet, Take 1 tablet (15 mg total) by mouth daily.   ibuprofen (ADVIL,MOTRIN) 200 MG tablet, Take 200 mg by mouth every 6 (six) hours as needed.  Current Outpatient Medications (Other):    tiZANidine  (ZANAFLEX ) 4 MG tablet, Take 1 tablet (4 mg total) by mouth at bedtime.   cholecalciferol (VITAMIN D3) 25 MCG (1000 UNIT) tablet, Take 1,000 Units by mouth daily.   magnesium (MAGTAB) 84 MG ( ) TBCR SR tablet, Take 84 mg by mouth.   tamoxifen  (NOLVADEX ) 10 MG tablet, TAKE 1 TABLET BY MOUTH EVERY DAY   vitamin E 200 UNIT capsule, Take 200 Units by mouth daily.   Reviewed prior external information including notes and imaging from  primary care provider As well as notes that were available from care everywhere and other healthcare systems.  Past medical history, social, surgical and family history all reviewed in electronic medical record.  No pertanent information unless stated regarding to the chief complaint.   Review of Systems:  No headache, visual changes, nausea, vomiting, diarrhea, constipation, dizziness, abdominal pain, skin rash, fevers, chills, night sweats, weight loss, swollen lymph nodes, body aches, joint swelling, chest pain, shortness of breath, mood changes. POSITIVE muscle aches  Objective  Blood pressure (!) 124/90, pulse 76, height 5' 6 (1.676 m), weight 144 lb (65.3 kg), last menstrual period 12/28/2011, SpO2 98%.   General: No apparent distress alert and oriented x3 mood and affect normal, dressed appropriately.  HEENT: Pupils equal, extraocular movements intact  Respiratory: Patient's speak in full sentences and does not appear short of breath  Cardiovascular: No lower extremity edema, non tender, no erythema   Upper back no show the patient does have some  tenderness in the parascapular area right greater than left.  Seems to have a potential slipped rib.  No midline tenderness.  No sternal pain noted.  No increased difficulty with deep inhalation.  Does have more of a fullness of the latissimus dorsi on the right greater than left at the near inferior angle of the scapula.    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  Patient given more scapular exercises for stability.  Discussed icing regimen and home exercises, discussed which activities to do and which ones to avoid.  Increase activity slowly.  Follow-up  again in 6 to 12 weeks Meloxicam  and Zanaflex  prescribed   Decision today to treat with OMT was based on Physical Exam  After verbal consent patient was treated with HVLA, ME, FPR techniques in cervical, thoracic, rib, lumbar and sacral areas, all areas are chronic   Patient tolerated the procedure well with improvement in symptoms  Patient given exercises, stretches and lifestyle modifications  See medications in patient instructions if given  Patient will follow up in 4-8 weeks  The above documentation has been reviewed and is accurate and complete Kevion Fatheree M Sammie Schermerhorn, DO        [1] No Known Allergies  "

## 2024-02-06 NOTE — Patient Instructions (Addendum)
 Meloxicam  daily for 10 days then PRN Zanaflex  Prescription Do prescribed exercises at least 3x a week  See you again in 6-8 weeks

## 2024-02-06 NOTE — Assessment & Plan Note (Signed)
 Do believe that there was an underlying latissimus dorsi injury that likely contributed to this.  Patient given more scapular exercises for stability.  Discussed icing regimen and home exercises, discussed which activities to do and which ones to avoid.  Increase activity slowly.  Follow-up again in 6 to 12 weeks

## 2024-02-07 ENCOUNTER — Other Ambulatory Visit: Payer: Self-pay

## 2024-02-07 ENCOUNTER — Encounter: Payer: Self-pay | Admitting: Family Medicine

## 2024-02-07 DIAGNOSIS — M25511 Pain in right shoulder: Secondary | ICD-10-CM

## 2024-02-07 DIAGNOSIS — R0789 Other chest pain: Secondary | ICD-10-CM

## 2024-02-07 NOTE — Progress Notes (Signed)
 " [name] [address] [place] [address] Phone: [phone] Subjective:   Shelly [name], am serving as a scribe for Dr. Arthea [name].  I'm seeing this patient by the request  of:  Shelly [name] LABOR, MD  CC: Follow-up right scapular area  YEP:Dlagzrupcz  Shelly [name] is a 54 y.o. female coming in with complaint of continued R shoulder and scapular pain. Patient states initially felt much better. Then got a bit worse.      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 [name]    No pertinent past medical history    Nodular basal cell carcinoma (BCC) 07/01/2020   Right Ala Nasi   Personal history of radiation therapy    PONV (postoperative nausea and vomiting)    Past Surgical History:  Procedure Laterality Date   ABDOMINAL HYSTERECTOMY     BREAST BIOPSY Left 12/31/2019   BREAST LUMPECTOMY Left 01/17/2020   BREAST LUMPECTOMY WITH RADIOACTIVE SEED AND SENTINEL LYMPH NODE BIOPSY Left 01/17/2020   Procedure: LEFT BREAST LUMPECTOMY WITH RADIOACTIVE SEED AND LEFT AXILLARY SENTINEL LYMPH NODE BIOPSY;  Surgeon: Shelly Cough, MD;  Location: [facility];  Service: General;  Laterality: Left;  PECTORAL BLOCK   CYSTOSCOPY  01/02/2012   Procedure: CYSTOSCOPY;  Surgeon: Shelly Shelly Chesterfield, MD;  Location: [facility];  Service: Gynecology;  Laterality: N/A;   DILATION AND CURETTAGE OF UTERUS     HYSTEROSCOPY     OPEN REDUCTION INTERNAL FIXATION (ORIF) DISTAL RADIAL FRACTURE Left 03/27/2016   Procedure: OPEN REDUCTION INTERNAL FIXATION (ORIF) DISTAL RADIAL FRACTURE;  Surgeon: Shelly Pagan, MD;  Location: [facility];  Service: Orthopedics;  Laterality: Left;   RE-EXCISION OF BREAST LUMPECTOMY Left 02/06/2020   Procedure: RE-EXCISION OF LEFT BREAST  LUMPECTOMY;  Surgeon: Shelly Cough, MD;  Location: [facility];  Service: General;  Laterality: Left;   ROBOTIC ASSISTED TOTAL HYSTERECTOMY  01/02/2012   Procedure: ROBOTIC ASSISTED TOTAL HYSTERECTOMY;  Surgeon: Shelly Shelly Chesterfield, MD;  Location: [facility];  Service: Gynecology;  Laterality: N/A;  Roboitc Bilateral Salpingectomy    TONSILLECTOMY     uterine ablation     Social History   Socioeconomic History   Marital status: Married    Spouse name: Not on file   Number of children: Not on file   Years of education: Not on file   Highest education level: Not on file  Occupational History   Not on file  Tobacco Use   Smoking status: Former    Current packs/day: 0.00    Types: Cigarettes    Quit date: 02/01/1996    Years since quitting: 28.0   Smokeless tobacco: Never  Vaping Use   Vaping status: Never Used  Substance and Sexual Activity   Alcohol use: Not Currently   Drug use: No   Sexual activity: Yes    Partners: Female    Birth control/protection: None  Other Topics Concern   Not on file  Social History Narrative   Not on file   Social Drivers of Health   Tobacco Use: Medium Risk (02/06/2024)   Patient History    Smoking Tobacco Use: Former    Smokeless Tobacco Use: Never    Passive Exposure: Not on Actuary Strain: Not on  file  Food Insecurity: Not on file  Transportation Needs: Not on file  Physical Activity: Not on file  Stress: Not on file  Social Connections: Not on file  Depression (PHQ2-9): Low Risk (12/14/2023)   Depression (PHQ2-9)    PHQ-2 Score: 0  Alcohol Screen: Not on file  Housing: Not on file  Utilities: Not on file  Health Literacy: Not on file   Allergies[1] Family History  Problem Relation Age of Onset   Thyroid disease Mother    Breast cancer Mother 64       breast cancer    Current Outpatient Medications (Respiratory):    guaiFENesin (MUCINEX) 600 MG 12 hr tablet, Take by mouth 2 (two) times  daily.  Current Outpatient Medications (Analgesics):    ibuprofen (ADVIL,MOTRIN) 200 MG tablet, Take 200 mg by mouth every 6 (six) hours as needed.   meloxicam  (MOBIC ) 15 MG tablet, Take 1 tablet (15 mg total) by mouth daily.  Current Outpatient Medications (Other):    cholecalciferol (VITAMIN D3) 25 MCG (1000 UNIT) tablet, Take 1,000 Units by mouth daily.   magnesium (MAGTAB) 84 MG ( ) TBCR SR tablet, Take 84 mg by mouth.   tamoxifen  (NOLVADEX ) 10 MG tablet, TAKE 1 TABLET BY MOUTH EVERY DAY   tiZANidine  (ZANAFLEX ) 4 MG tablet, Take 1 tablet (4 mg total) by mouth at bedtime.   vitamin E 200 UNIT capsule, Take 200 Units by mouth daily.   Reviewed prior external information including notes and imaging from  primary care provider As well as notes that were available from care everywhere and other healthcare systems.  Past medical history, social, surgical and family history all reviewed in electronic medical record.  No pertanent information unless stated regarding to the chief complaint.   Review of Systems:  No headache, visual changes, nausea, vomiting, diarrhea, constipation, dizziness, abdominal pain, skin rash, fevers, chills, night sweats, weight loss, swollen lymph nodes, body aches, joint swelling, chest pain, shortness of breath, mood changes. POSITIVE muscle aches  Objective  Last menstrual period 12/28/2011.   General: No apparent distress alert and oriented x3 mood and affect normal, dressed appropriately.  HEENT: Pupils equal, extraocular movements intact  Respiratory: Patient's speak in full sentences and does not appear short of breath patient does have rhonchi and wheezing expiratory in the right middle lobe, very localized. Cardiovascular: No lower extremity edema, non tender, no erythema  Right shoulder exam shows Upper back exam shows tenderness to palpation again but nothing severe.      Impression and Recommendations:     The above documentation has been  reviewed and is accurate and complete Shaylinn Hladik M Shenea Giacobbe, DO       [1] No Known Allergies  "

## 2024-02-08 ENCOUNTER — Ambulatory Visit (INDEPENDENT_AMBULATORY_CARE_PROVIDER_SITE_OTHER): Admitting: Family Medicine

## 2024-02-08 ENCOUNTER — Ambulatory Visit

## 2024-02-08 VITALS — Ht 66.0 in | Wt 143.0 lb

## 2024-02-08 DIAGNOSIS — J189 Pneumonia, unspecified organism: Secondary | ICD-10-CM | POA: Insufficient documentation

## 2024-02-08 DIAGNOSIS — M25511 Pain in right shoulder: Secondary | ICD-10-CM | POA: Diagnosis not present

## 2024-02-08 DIAGNOSIS — R0789 Other chest pain: Secondary | ICD-10-CM

## 2024-02-08 MED ORDER — PREDNISONE 20 MG PO TABS: 20.0000 mg | ORAL_TABLET | Freq: Every day | ORAL | 0 refills | Status: AC

## 2024-02-08 MED ORDER — AZITHROMYCIN 500 MG PO TABS: 500.0000 mg | ORAL_TABLET | Freq: Every day | ORAL | 0 refills | Status: AC

## 2024-02-08 NOTE — Assessment & Plan Note (Signed)
 Patient's x-rays of the chest does show some potential infiltrate in the right middle lobe.  In addition to that patient also has wheezing and rhonchi noted on exam today.  Only in the middle lobe.  Azithromycin  and prednisone  given.  I do not believe that this is secondary to the slipped rib that was noted previously.  Do not see a true fracture but overread is pending.  Depending on findings and how patient responds would need to consider potential for a CT scan.  Patient knows if shortness of breath occurs to seek medical attention.  Follow-up again in 6 to 12 weeks

## 2024-02-08 NOTE — Patient Instructions (Addendum)
 Prednisone  20mg  for 7 days Azithromycin  500mg  for 3 days  Can restart meloxicam  after finish prednisone  if needed Worsen SOB seek medical attention Keep other follow up appointment

## 2024-02-15 ENCOUNTER — Ambulatory Visit: Payer: Self-pay | Admitting: Family Medicine

## 2024-02-28 ENCOUNTER — Other Ambulatory Visit: Payer: Self-pay | Admitting: Family Medicine

## 2024-03-01 ENCOUNTER — Other Ambulatory Visit: Payer: Self-pay

## 2024-03-01 ENCOUNTER — Encounter: Payer: Self-pay | Admitting: Family Medicine

## 2024-03-01 ENCOUNTER — Ambulatory Visit: Admitting: Family Medicine

## 2024-03-01 VITALS — BP 114/80 | HR 104 | Ht 66.0 in

## 2024-03-01 DIAGNOSIS — M25511 Pain in right shoulder: Secondary | ICD-10-CM | POA: Insufficient documentation

## 2024-03-01 NOTE — Assessment & Plan Note (Signed)
 Has had left-sided previously and now more right sided.  Given injection in the shoulder for diagnostic as well as hopefully therapeutic purposes.  Continuing to have problems would need to consider the possibility of advanced imaging with MRI of the shoulder and neck with patient not making any significant progress with any other conservative therapies at the moment.  Depending on that finding then would need to consider the possibility of the CT scan of the chest with some of this being in the scapular region.  Follow-up again in 6 to 8 weeks otherwise.

## 2024-03-01 NOTE — Patient Instructions (Addendum)
 Injected shoulder today If not better by Tuesday we will get MRI shoulder and neck See me in 6-8 weeks

## 2024-04-17 ENCOUNTER — Ambulatory Visit: Admitting: Family Medicine

## 2024-11-14 ENCOUNTER — Inpatient Hospital Stay: Admitting: Hematology and Oncology
